# Patient Record
Sex: Male | Born: 1975
Health system: Southern US, Community
[De-identification: ages and names within clinical notes are randomized; demographics above are authoritative.]

## PROBLEM LIST (undated history)

## (undated) DIAGNOSIS — E785 Hyperlipidemia, unspecified: Secondary | ICD-10-CM

## (undated) DIAGNOSIS — I10 Essential (primary) hypertension: Secondary | ICD-10-CM

## (undated) HISTORY — DX: Essential (primary) hypertension: I10

## (undated) HISTORY — PX: FINGER SURGERY: SHX640

## (undated) HISTORY — DX: Hyperlipidemia, unspecified: E78.5

---

## 2002-11-02 ENCOUNTER — Emergency Department (HOSPITAL_COMMUNITY): Admission: EM | Admit: 2002-11-02 | Discharge: 2002-11-02 | Payer: Self-pay | Admitting: Emergency Medicine

## 2003-08-27 ENCOUNTER — Encounter: Payer: Self-pay | Admitting: Emergency Medicine

## 2003-08-27 ENCOUNTER — Emergency Department (HOSPITAL_COMMUNITY): Admission: EM | Admit: 2003-08-27 | Discharge: 2003-08-27 | Payer: Self-pay | Admitting: Emergency Medicine

## 2003-09-25 ENCOUNTER — Other Ambulatory Visit: Payer: Self-pay

## 2011-08-04 ENCOUNTER — Emergency Department: Payer: Self-pay | Admitting: Emergency Medicine

## 2014-08-06 DIAGNOSIS — R7301 Impaired fasting glucose: Secondary | ICD-10-CM | POA: Insufficient documentation

## 2014-08-06 DIAGNOSIS — E785 Hyperlipidemia, unspecified: Secondary | ICD-10-CM | POA: Insufficient documentation

## 2014-08-30 DIAGNOSIS — Z87438 Personal history of other diseases of male genital organs: Secondary | ICD-10-CM | POA: Insufficient documentation

## 2014-10-19 DIAGNOSIS — I10 Essential (primary) hypertension: Secondary | ICD-10-CM | POA: Insufficient documentation

## 2015-08-15 ENCOUNTER — Ambulatory Visit (INDEPENDENT_AMBULATORY_CARE_PROVIDER_SITE_OTHER): Payer: 59 | Admitting: Family Medicine

## 2015-08-15 ENCOUNTER — Encounter: Payer: Self-pay | Admitting: Family Medicine

## 2015-08-15 VITALS — BP 149/104 | HR 78 | Ht 72.0 in | Wt 242.0 lb

## 2015-08-15 DIAGNOSIS — Z23 Encounter for immunization: Secondary | ICD-10-CM | POA: Diagnosis not present

## 2015-08-15 DIAGNOSIS — R7301 Impaired fasting glucose: Secondary | ICD-10-CM

## 2015-08-15 DIAGNOSIS — E785 Hyperlipidemia, unspecified: Secondary | ICD-10-CM

## 2015-08-15 DIAGNOSIS — I10 Essential (primary) hypertension: Secondary | ICD-10-CM

## 2015-08-15 LAB — CBC
HCT: 44.7 % (ref 39.0–52.0)
Hemoglobin: 15.1 g/dL (ref 13.0–17.0)
MCH: 29.2 pg (ref 26.0–34.0)
MCHC: 33.8 g/dL (ref 30.0–36.0)
MCV: 86.5 fL (ref 78.0–100.0)
MPV: 11.5 fL (ref 8.6–12.4)
Platelets: 246 10*3/uL (ref 150–400)
RBC: 5.17 MIL/uL (ref 4.22–5.81)
RDW: 13.6 % (ref 11.5–15.5)
WBC: 9.1 10*3/uL (ref 4.0–10.5)

## 2015-08-15 LAB — LIPID PANEL
Cholesterol: 245 mg/dL — ABNORMAL HIGH (ref 125–200)
HDL: 30 mg/dL — ABNORMAL LOW (ref >=40)
LDL Cholesterol: 187 mg/dL — ABNORMAL HIGH (ref <130)
Total CHOL/HDL Ratio: 8.2 Ratio — ABNORMAL HIGH (ref <=5.0)
Triglycerides: 140 mg/dL (ref <150)
VLDL: 28 mg/dL (ref <30)

## 2015-08-15 LAB — URIC ACID: URIC ACID, SERUM: 8.4 mg/dL — AB (ref 4.0–7.8)

## 2015-08-15 LAB — COMPREHENSIVE METABOLIC PANEL
ALT: 16 U/L (ref 9–46)
AST: 17 U/L (ref 10–40)
Albumin: 4.6 g/dL (ref 3.6–5.1)
Alkaline Phosphatase: 70 U/L (ref 40–115)
BUN: 14 mg/dL (ref 7–25)
CO2: 31 mmol/L (ref 20–31)
Calcium: 10 mg/dL (ref 8.6–10.3)
Chloride: 100 mmol/L (ref 98–110)
Creat: 1.08 mg/dL (ref 0.60–1.35)
Glucose, Bld: 117 mg/dL — ABNORMAL HIGH (ref 65–99)
Potassium: 3.9 mmol/L (ref 3.5–5.3)
Sodium: 140 mmol/L (ref 135–146)
Total Bilirubin: 0.4 mg/dL (ref 0.2–1.2)
Total Protein: 7.5 g/dL (ref 6.1–8.1)

## 2015-08-15 MED ORDER — AMLODIPINE BESYLATE 10 MG PO TABS: 10.0000 mg | ORAL_TABLET | Freq: Every day | ORAL | Status: DC

## 2015-08-15 MED ORDER — CHLORTHALIDONE 25 MG PO TABS: 25.0000 mg | ORAL_TABLET | Freq: Every day | ORAL | Status: DC

## 2015-08-15 NOTE — Progress Notes (Signed)
Robert Cantrell is a 39 y.o. male who presents to [facility]  today for establish care and discuss blood pressure.  1) HTN: Currently reasonable control with chlorthalidone and amlodipine. No symptoms such as chest pain palpitations or shortness of breath. Patient does note a mild headache currently the last few days.  2) headache: Present for 2 days intermittent bilaterally posterior. No weakness or numbness loss of function or vision change. No treatment tried yet.  3) history of phimosis currently resolved status post surgery  4) history of elevated fasting blood sugar: No history of diabetes. No polyuria or polydipsia.   No past medical history on file. No past surgical history on file. Social History  Substance Use Topics  . Smoking status: Not on file  . Smokeless tobacco: Not on file  . Alcohol Use: Not on file   family history is not on file.  ROS as above; no weight loss or weight gain feeling too hot or feeling too cold. No skin changes. No abdominal pain nausea vomiting or diarrhea. Medications: No current outpatient prescriptions on file.   No current facility-administered medications for this visit.   Allergies not on file   Exam:  BP 149/104 mmHg  Pulse 78  Ht 6' (1.829 m)  Wt 242 lb (109.77 kg)  BMI 32.81 kg/m2 Gen: Well NAD HEENT: EOMI,  MMM normal posterior pharynx Lungs: Normal work of breathing. CTABL Heart: RRR no MRG Abd: NABS, Soft. Nondistended, Nontender Exts: Brisk capillary refill, warm and well perfused.  Psych: Alert and oriented normal affect and speech and thought process.  No results found for this or any previous visit (from the past 24 hour(s)). No results found.  Flu vaccine given prior to discharge today. Please see individual assessment and plan sections.

## 2015-08-15 NOTE — Patient Instructions (Signed)
Thank you for coming in today. Return in 1 month. Call or go to the emergency room if you get worse, have trouble breathing, have chest pains, or palpitations.   Hypertension Hypertension, commonly called high blood pressure, is when the force of blood pumping through your arteries is too strong. Your arteries are the blood vessels that carry blood from your heart throughout your body. A blood pressure reading consists of a higher number over a lower number, such as 110/72. The higher number (systolic) is the pressure inside your arteries when your heart pumps. The lower number (diastolic) is the pressure inside your arteries when your heart relaxes. Ideally you want your blood pressure below 120/80. Hypertension forces your heart to work harder to pump blood. Your arteries may become narrow or stiff. Having hypertension puts you at risk for heart disease, stroke, and other problems.  RISK FACTORS Some risk factors for high blood pressure are controllable. Others are not.  Risk factors you cannot control include:   Race. You may be at higher risk if you are African American.  Age. Risk increases with age.  Gender. Men are at higher risk than women before age 49 years. After age 58, women are at higher risk than men. Risk factors you can control include:  Not getting enough exercise or physical activity.  Being overweight.  Getting too much fat, sugar, calories, or salt in your diet.  Drinking too much alcohol. SIGNS AND SYMPTOMS Hypertension does not usually cause signs or symptoms. Extremely high blood pressure (hypertensive crisis) may cause headache, anxiety, shortness of breath, and nosebleed. DIAGNOSIS  To check if you have hypertension, your health care provider will measure your blood pressure while you are seated, with your arm held at the level of your heart. It should be measured at least twice using the same arm. Certain conditions can cause a difference in blood pressure  between your right and left arms. A blood pressure reading that is higher than normal on one occasion does not mean that you need treatment. If one blood pressure reading is high, ask your health care provider about having it checked again. TREATMENT  Treating high blood pressure includes making lifestyle changes and possibly taking medicine. Living a healthy lifestyle can help lower high blood pressure. You may need to change some of your habits. Lifestyle changes may include:  Following the DASH diet. This diet is high in fruits, vegetables, and whole grains. It is low in salt, red meat, and added sugars.  Getting at least 2 hours of brisk physical activity every week.  Losing weight if necessary.  Not smoking.  Limiting alcoholic beverages.  Learning ways to reduce stress. If lifestyle changes are not enough to get your blood pressure under control, your health care provider may prescribe medicine. You may need to take more than one. Work closely with your health care provider to understand the risks and benefits. HOME CARE INSTRUCTIONS  Have your blood pressure rechecked as directed by your health care provider.   Take medicines only as directed by your health care provider. Follow the directions carefully. Blood pressure medicines must be taken as prescribed. The medicine does not work as well when you skip doses. Skipping doses also puts you at risk for problems.   Do not smoke.   Monitor your blood pressure at home as directed by your health care provider. SEEK MEDICAL CARE IF:   You think you are having a reaction to medicines taken.  You have recurrent  headaches or feel dizzy.  You have swelling in your ankles.  You have trouble with your vision. SEEK IMMEDIATE MEDICAL CARE IF:  You develop a severe headache or confusion.  You have unusual weakness, numbness, or feel faint.  You have severe chest or abdominal pain.  You vomit repeatedly.  You have trouble  breathing. MAKE SURE YOU:   Understand these instructions.  Will watch your condition.  Will get help right away if you are not doing well or get worse. Document Released: 11/02/2005 Document Revised: 03/19/2014 Document Reviewed: 08/25/2013 [facility] Patient Information 2015 [place], [place]. This information is not intended to replace advice given to you by your health care provider. Make sure you discuss any questions you have with your health care provider.

## 2015-08-15 NOTE — Assessment & Plan Note (Signed)
Increase amlodipine from 5 mg to 10 mg. Continue chlorthalidone. Check metabolic panel, and TSH. Return in one month

## 2015-08-15 NOTE — Assessment & Plan Note (Signed)
Check fasting glucose today 

## 2015-08-15 NOTE — Assessment & Plan Note (Signed)
Check fasting lipids and vitamin D and CBC today.

## 2015-08-16 LAB — VITAMIN D 25 HYDROXY (VIT D DEFICIENCY, FRACTURES): VIT D 25 HYDROXY: 20 ng/mL — AB (ref 30–100)

## 2015-08-16 LAB — TSH: TSH: 1.14 u[IU]/mL (ref 0.350–4.500)

## 2015-08-16 MED ORDER — ATORVASTATIN CALCIUM 40 MG PO TABS: 40.0000 mg | ORAL_TABLET | Freq: Every day | ORAL | Status: DC

## 2015-08-16 NOTE — Addendum Note (Signed)
Addended by: Rodolph Bong on: 08/16/2015 08:18 AM   Modules accepted: Orders, SmartSet

## 2015-08-16 NOTE — Progress Notes (Signed)
Quick Note:  1) patient has prediabetes. Work on diet and weight loss. 2) patient has elevated cholesterol. We'll start Lipitor 3) vitamin D is low. Certainly taking 4000 units of vitamin D daily over-the-counter. 4) uric acid is elevated. This increases patient's risk for gout. We'll discuss oriented about this further at the next visit. No treatment needed at this time ______

## 2015-08-20 ENCOUNTER — Telehealth: Payer: Self-pay | Admitting: Family Medicine

## 2015-09-04 ENCOUNTER — Telehealth: Payer: Self-pay

## 2015-09-04 DIAGNOSIS — I10 Essential (primary) hypertension: Secondary | ICD-10-CM

## 2015-09-04 MED ORDER — AMLODIPINE BESYLATE 10 MG PO TABS: 10.0000 mg | ORAL_TABLET | Freq: Every day | ORAL | Status: DC

## 2015-09-04 NOTE — Telephone Encounter (Signed)
Medication sent to wrong pharmacy will resend.

## 2015-09-12 ENCOUNTER — Ambulatory Visit: Payer: 59 | Admitting: Family Medicine

## 2015-09-23 ENCOUNTER — Ambulatory Visit (INDEPENDENT_AMBULATORY_CARE_PROVIDER_SITE_OTHER): Payer: 59 | Admitting: Family Medicine

## 2015-09-23 ENCOUNTER — Encounter: Payer: Self-pay | Admitting: Family Medicine

## 2015-09-23 VITALS — BP 133/89 | HR 88 | Wt 241.0 lb

## 2015-09-23 DIAGNOSIS — E785 Hyperlipidemia, unspecified: Secondary | ICD-10-CM | POA: Insufficient documentation

## 2015-09-23 DIAGNOSIS — R7989 Other specified abnormal findings of blood chemistry: Secondary | ICD-10-CM | POA: Diagnosis not present

## 2015-09-23 DIAGNOSIS — I10 Essential (primary) hypertension: Secondary | ICD-10-CM

## 2015-09-23 DIAGNOSIS — R7301 Impaired fasting glucose: Secondary | ICD-10-CM | POA: Diagnosis not present

## 2015-09-23 DIAGNOSIS — E790 Hyperuricemia without signs of inflammatory arthritis and tophaceous disease: Secondary | ICD-10-CM | POA: Insufficient documentation

## 2015-09-23 LAB — COMPREHENSIVE METABOLIC PANEL
ALK PHOS: 90 U/L (ref 40–115)
ALT: 25 U/L (ref 9–46)
AST: 22 U/L (ref 10–40)
Albumin: 4.5 g/dL (ref 3.6–5.1)
BILIRUBIN TOTAL: 0.7 mg/dL (ref 0.2–1.2)
BUN: 14 mg/dL (ref 7–25)
CO2: 30 mmol/L (ref 20–31)
Calcium: 9.8 mg/dL (ref 8.6–10.3)
Chloride: 98 mmol/L (ref 98–110)
Creat: 0.99 mg/dL (ref 0.60–1.35)
GLUCOSE: 104 mg/dL — AB (ref 65–99)
Potassium: 3.3 mmol/L — ABNORMAL LOW (ref 3.5–5.3)
Sodium: 141 mmol/L (ref 135–146)
Total Protein: 7.7 g/dL (ref 6.1–8.1)

## 2015-09-23 LAB — LIPID PANEL
Cholesterol: 169 mg/dL (ref 125–200)
HDL: 32 mg/dL — ABNORMAL LOW (ref >=40)
LDL Cholesterol: 114 mg/dL (ref <130)
Total CHOL/HDL Ratio: 5.3 Ratio — ABNORMAL HIGH (ref <=5.0)
Triglycerides: 114 mg/dL (ref <150)
VLDL: 23 mg/dL (ref <30)

## 2015-09-23 LAB — CK: CK TOTAL: 205 U/L (ref 7–232)

## 2015-09-23 NOTE — Assessment & Plan Note (Signed)
No gout flares. Low purine diet

## 2015-09-23 NOTE — Progress Notes (Signed)
Robert LaineKelvin Cantrell is a 39 y.o. male who presents to [facility]  today for follow-up blood pressure and lipids. Patient was seen about a month ago and his amlodipine was increased and he was started on Lipitor.  1) blood pressure: Doing well with no issues. No chest pains palpitations or shortness of breath. Tolerating medicines well.  2) elevated cholesterol: Patient was started on Lipitor. He feels well with no issues. No muscle pain.  3) elevated fasting blood sugar: Patient was found to have elevated fasting blood sugar. He denies any polyuria or polydipsia. He denies any history of diabetes.  4) elevated uric acid: Patient was found to have an elevated uric acid level of 8.4. He denies any personal history of gout flares. He feels well.   Past Medical History  Diagnosis Date  . Hypertension    Past Surgical History  Procedure Laterality Date  . Finger surgery     Social History  Substance Use Topics  . Smoking status: Never Smoker   . Smokeless tobacco: Not on file  . Alcohol Use: 0.0 oz/week    0 Standard drinks or equivalent per week   family history includes Diabetes in his father, maternal grandfather, maternal grandmother, mother, paternal grandfather, and paternal grandmother; Hypertension in his brother, father, mother, and sister.  ROS as above Medications: Current Outpatient Prescriptions  Medication Sig Dispense Refill  . amLODipine (NORVASC) 10 MG tablet Take 1 tablet (10 mg total) by mouth daily. 30 tablet 1  . atorvastatin (LIPITOR) 40 MG tablet Take 1 tablet (40 mg total) by mouth daily. 30 tablet 3  . chlorthalidone (HYGROTON) 25 MG tablet Take 1 tablet (25 mg total) by mouth daily. 30 tablet 1   No current facility-administered medications for this visit.   No Known Allergies   Exam:  BP 133/89 mmHg  Pulse 88  Wt 241 lb (109.317 kg) Gen: Well NAD HEENT: EOMI,  MMM Lungs: Normal work of breathing. CTABL Heart: RRR  no MRG Abd: NABS, Soft. Nondistended, Nontender Exts: Brisk capillary refill, warm and well perfused.   No results found for this or any previous visit (from the past 24 hour(s)). No results found.   Please see individual assessment and plan sections.

## 2015-09-23 NOTE — Assessment & Plan Note (Signed)
-   Weight loss 

## 2015-09-23 NOTE — Assessment & Plan Note (Signed)
Control improved. Continue current regimen. Recheck in 6 months. Check metabolic panel today.

## 2015-09-23 NOTE — Patient Instructions (Signed)
Thank you for coming in today. Return in 6 months.  Call or go to the emergency room if you get worse, have trouble breathing, have chest pains, or palpitations.

## 2015-09-24 NOTE — Progress Notes (Signed)
Quick Note:  Labs look great ______ 

## 2015-10-09 NOTE — Telephone Encounter (Signed)
Note opened in error.

## 2015-11-12 ENCOUNTER — Other Ambulatory Visit: Payer: Self-pay

## 2015-11-12 DIAGNOSIS — I10 Essential (primary) hypertension: Secondary | ICD-10-CM

## 2015-11-12 MED ORDER — AMLODIPINE BESYLATE 10 MG PO TABS: 10.0000 mg | ORAL_TABLET | Freq: Every day | ORAL | Status: DC

## 2015-11-12 NOTE — Telephone Encounter (Signed)
Patient called requesting norvasc refill.  After reviewing the chart, refill approved and sent to [facility].  Patient aware.

## 2015-11-22 MED FILL — ATORVASTATIN 40 MG TABLET: 40 | 30 days supply | Qty: 30 | Fill #2

## 2015-11-25 ENCOUNTER — Other Ambulatory Visit: Payer: Self-pay | Admitting: Family Medicine

## 2015-11-25 DIAGNOSIS — I10 Essential (primary) hypertension: Secondary | ICD-10-CM

## 2015-11-25 MED ORDER — CHLORTHALIDONE 25 MG PO TABS: 25.0000 mg | ORAL_TABLET | Freq: Every day | ORAL | Status: DC

## 2015-11-25 MED FILL — CHLORTHALIDONE 25 MG TABLET: 25 | 30 days supply | Qty: 30 | Fill #0

## 2015-12-24 ENCOUNTER — Encounter (HOSPITAL_COMMUNITY): Payer: Self-pay | Admitting: Emergency Medicine

## 2015-12-24 ENCOUNTER — Emergency Department (HOSPITAL_COMMUNITY)
Admission: EM | Admit: 2015-12-24 | Discharge: 2015-12-24 | Disposition: A | Discharge status: Home / Self Care | Payer: 59 | Source: Home / Self Care | Attending: Family Medicine | Admitting: Family Medicine

## 2015-12-24 DIAGNOSIS — J340 Abscess, furuncle and carbuncle of nose: Secondary | ICD-10-CM | POA: Diagnosis not present

## 2015-12-24 MED ORDER — CEPHALEXIN 500 MG PO CAPS: 500.0000 mg | ORAL_CAPSULE | Freq: Three times a day (TID) | ORAL | Status: DC

## 2015-12-24 MED ORDER — MUPIROCIN CALCIUM 2 % NA OINT: TOPICAL_OINTMENT | NASAL | Status: DC

## 2015-12-24 NOTE — ED Provider Notes (Signed)
CSN: [id number]     Arrival date & time 12/24/15  1736 History   First MD Initiated Contact with Patient 12/24/15 1922     No chief complaint on file.  (Consider location/radiation/quality/duration/timing/severity/associated sxs/prior Treatment) HPI History obtained from patient:   LOCATION:face/nose SEVERITY:4 DURATION:2 days CONTEXT:sudden onset of swelling and pain in right nostril QUALITY: MODIFYING FACTORS:none  ASSOCIATED SYMPTOMS:pain in right ear TIMING:constant OCCUPATION:  Past Medical History  Diagnosis Date  . Hypertension    Past Surgical History  Procedure Laterality Date  . Finger surgery     Family History  Problem Relation Age of Onset  . Diabetes Mother   . Hypertension Mother   . Diabetes Father   . Hypertension Father   . Hypertension Sister   . Hypertension Brother   . Diabetes Maternal Grandmother   . Diabetes Maternal Grandfather   . Diabetes Paternal Grandmother   . Diabetes Paternal Grandfather    Social History  Substance Use Topics  . Smoking status: Never Smoker   . Smokeless tobacco: Not on file  . Alcohol Use: 0.0 oz/week    0 Standard drinks or equivalent per week    Review of Systems ROS +'venasal pain  Denies: HEADACHE, NAUSEA, ABDOMINAL PAIN, CHEST PAIN, CONGESTION, DYSURIA, SHORTNESS OF BREATH  Allergies  Review of patient's allergies indicates no known allergies.  Home Medications   Prior to Admission medications   Medication Sig Start Date End Date Taking? Authorizing Provider  amLODipine (NORVASC) 10 MG tablet Take 1 tablet (10 mg total) by mouth daily. 11/12/15   Rodolph Bong, MD  atorvastatin (LIPITOR) 40 MG tablet Take 1 tablet (40 mg total) by mouth daily. 08/16/15   Rodolph Bong, MD  chlorthalidone (HYGROTON) 25 MG tablet Take 1 tablet (25 mg total) by mouth daily. 11/25/15 11/24/16  Rodolph Bong, MD   Meds Ordered and Administered this Visit  Medications - No data to display  There were no vitals taken for this  visit. No data found.   Physical Exam  Constitutional: He is oriented to person, place, and time. He appears well-developed and well-nourished.  HENT:  Head: Normocephalic and atraumatic.  Right Ear: External ear normal.  Left Ear: External ear normal.  Nose: Sinus tenderness present.    Very small abscess on floor of nostril. No I&D.  Pulmonary/Chest: Effort normal and breath sounds normal.  Musculoskeletal: Normal range of motion.  Neurological: He is alert and oriented to person, place, and time.  Skin: Skin is warm and dry.  Psychiatric: He has a normal mood and affect. His behavior is normal.  Nursing note and vitals reviewed.   ED Course  Procedures (including critical care time)  Labs Review Labs Reviewed - No data to display  Imaging Review No results found.   Visual Acuity Review  Right Eye Distance:   Left Eye Distance:   Bilateral Distance:    Right Eye Near:   Left Eye Near:    Bilateral Near:         MDM   1. Abscess of nasal cavity    Patient is advised to continue home symptomatic treatment. Prescription for Keflex, Bactroban  sent pharmacy patient has indicated. Patient is advised that if there are new or worsening symptoms or attend the emergency department, or contact primary care provider. Instructions of care provided discharged home in stable condition. Return to work/school note provided.  THIS NOTE WAS GENERATED USING A VOICE RECOGNITION SOFTWARE PROGRAM. ALL REASONABLE EFFORTS  WERE MADE  TO PROOFREAD THIS DOCUMENT FOR ACCURACY.     Tharon Aquas, PA 12/24/15 [phone]

## 2015-12-24 NOTE — ED Notes (Signed)
Pt reports abscess inside right nostril onset x1 week associated w/pain 8/10 that radiates to back of head A&O x4... No acute distress.

## 2015-12-24 NOTE — Discharge Instructions (Signed)

## 2015-12-25 MED FILL — MUPIROCIN 2% OINTMENT: 2 | 30 days supply | Qty: 22 | Fill #0

## 2015-12-25 MED FILL — CEPHALEXIN 500 MG CAPSULE: 500 | 7 days supply | Qty: 21 | Fill #0

## 2015-12-27 ENCOUNTER — Other Ambulatory Visit: Payer: Self-pay | Admitting: Family Medicine

## 2015-12-27 MED FILL — CHLORTHALIDONE 25 MG TABLET: 25 | 30 days supply | Qty: 30 | Fill #1

## 2015-12-27 MED FILL — ATORVASTATIN 40 MG TABLET: 40 | 90 days supply | Qty: 90 | Fill #0

## 2016-02-03 MED FILL — CHLORTHALIDONE 25 MG TABLET: 25 | 30 days supply | Qty: 30 | Fill #2

## 2016-02-21 MED FILL — AMLODIPINE BESYLATE 10 MG T: 10 | 90 days supply | Qty: 90 | Fill #1

## 2016-02-26 ENCOUNTER — Ambulatory Visit (INDEPENDENT_AMBULATORY_CARE_PROVIDER_SITE_OTHER): Payer: 59

## 2016-02-26 ENCOUNTER — Encounter: Payer: Self-pay | Admitting: Family Medicine

## 2016-02-26 ENCOUNTER — Ambulatory Visit (INDEPENDENT_AMBULATORY_CARE_PROVIDER_SITE_OTHER): Payer: 59 | Admitting: Family Medicine

## 2016-02-26 VITALS — BP 133/91 | HR 69 | Wt 244.0 lb

## 2016-02-26 DIAGNOSIS — W19XXXA Unspecified fall, initial encounter: Secondary | ICD-10-CM

## 2016-02-26 DIAGNOSIS — M25512 Pain in left shoulder: Secondary | ICD-10-CM

## 2016-02-26 MED ORDER — NAPROXEN 500 MG PO TABS
500.0000 mg | ORAL_TABLET | Freq: Two times a day (BID) | ORAL | Status: AC
Start: 2016-02-26 — End: ?

## 2016-02-26 MED FILL — NAPROXEN 500 MG TABLET: 500 | 15 days supply | Qty: 30 | Fill #0

## 2016-02-26 NOTE — Patient Instructions (Addendum)
Thank you for coming in today. We will call with results.  Return in 2 weeks.  Take naproxen for pain as needed.  Use the sling.

## 2016-02-26 NOTE — Assessment & Plan Note (Signed)
Fall at work. This is a work-related injury. He has what appears to be a fracture of the acromion. However the screening used to view the images is not technically adequate for this. Plan for sling NSAIDs and recheck in 2 weeks. Awaiting formal radiology review when the PACS system is operable.

## 2016-02-26 NOTE — Progress Notes (Signed)
   Robert Cantrell is a 40 y.o. male who presents to [facility] today for fall. Patient fell yesterday at work landing on his back. He thinks he may have landed on his outstretched left arm. He denies any immediate severe pain directly after the fall. However he notes worsening soreness in his back and especially into his left shoulder. He notes severe pain especially with overhand motion on the left side. He denies any radiating pain weakness or numbness. He's tried some Excedrin which helped a bit yesterday evening.   Past Medical History  Diagnosis Date  . Hypertension    Past Surgical History  Procedure Laterality Date  . Finger surgery     Social History  Substance Use Topics  . Smoking status: Never Smoker   . Smokeless tobacco: Not on file  . Alcohol Use: 0.0 oz/week    0 Standard drinks or equivalent per week   family history includes Diabetes in his father, maternal grandfather, maternal grandmother, mother, paternal grandfather, and paternal grandmother; Hypertension in his brother, father, mother, and sister.  ROS:  No headache, visual changes, nausea, vomiting, diarrhea, constipation, dizziness, abdominal pain, skin rash, fevers, chills, night sweats, weight loss, swollen lymph nodes, body aches, joint swelling, muscle aches, chest pain, shortness of breath, mood changes, visual or auditory hallucinations.    Medications: Current Outpatient Prescriptions  Medication Sig Dispense Refill  . amLODipine (NORVASC) 10 MG tablet Take 1 tablet (10 mg total) by mouth daily. 90 tablet 1  . atorvastatin (LIPITOR) 40 MG tablet TAKE 1 TABLET BY MOUTH DAILY 30 tablet PRN  . chlorthalidone (HYGROTON) 25 MG tablet Take 1 tablet (25 mg total) by mouth daily. 30 tablet 4  . mupirocin nasal ointment (BACTROBAN) 2 % Apply in each nostril daily 10 g 1   No current facility-administered medications for this visit.   No Known Allergies   Exam:    BP 133/91 mmHg  Pulse 69  Wt 244 lb (110.678 kg) General: Well Developed, well nourished, and in no acute distress.  Neuro/Psych: Alert and oriented x3, extra-ocular muscles intact, able to move all 4 extremities, sensation grossly intact. Skin: Warm and dry, no rashes noted.  Respiratory: Not using accessory muscles, speaking in full sentences, trachea midline.  Cardiovascular: Pulses palpable, no extremity edema. Abdomen: Does not appear distended. MSK: Neck is nontender to spinal midline. Back is nontender to spinal midline. He is diffusely tender to the thoracic and lumbar paraspinals. Left shoulder is normal appearing but is tender especially over the [facility] joint area. Significantly decreased motion especially with external rotation abduction. Internal rotation is limited to the iliac crest. Patient guards with shoulder exam. Patient is generally weak with abduction and external rotation resistance strength testing 4/5 Pulses capillary refill and sensation intact.   X-ray left shoulder: Review shows a mildly angulated inferiorly displaced fracture of the acromion.  X-rays reviewed on the x-ray device screen as the PACS system is not currently operable area Awaiting formal radiology review  No results found for this or any previous visit (from the past 24 hour(s)). No results found.   Please see individual assessment and plan sections.

## 2016-02-27 NOTE — Progress Notes (Signed)
Quick Note:  The shoulder xray has the appearance of an old injury and not a new fracture. I think we can continue the sling for a week. Recheck next week. ______

## 2016-03-11 ENCOUNTER — Encounter: Payer: Self-pay | Admitting: Family Medicine

## 2016-03-11 ENCOUNTER — Ambulatory Visit (INDEPENDENT_AMBULATORY_CARE_PROVIDER_SITE_OTHER): Payer: 59 | Admitting: Family Medicine

## 2016-03-11 ENCOUNTER — Telehealth: Payer: Self-pay | Admitting: Family Medicine

## 2016-03-11 VITALS — BP 133/92 | HR 92 | Wt 246.0 lb

## 2016-03-11 DIAGNOSIS — R7989 Other specified abnormal findings of blood chemistry: Secondary | ICD-10-CM

## 2016-03-11 DIAGNOSIS — M25512 Pain in left shoulder: Secondary | ICD-10-CM

## 2016-03-11 DIAGNOSIS — R6882 Decreased libido: Secondary | ICD-10-CM

## 2016-03-11 LAB — TSH: TSH: 1.98 mIU/L (ref 0.40–4.50)

## 2016-03-11 LAB — TESTOSTERONE: TESTOSTERONE: 235 ng/dL — AB (ref 250–827)

## 2016-03-11 MED FILL — CHLORTHALIDONE 25 MG TABLET: 25 | 30 days supply | Qty: 30 | Fill #3

## 2016-03-11 NOTE — Patient Instructions (Signed)
Thank you for coming in today. Resume work.  Attend PT.  Return in 3-4 weeks.

## 2016-03-11 NOTE — Progress Notes (Signed)
   Robert Cantrell is a 40 y.o. male who presents to [facility] today for follow-up left shoulder pain. Patient was seen on April 12 for work-related injury to his left shoulder. X-ray shows a displacement of os acromiale versus old fracture to the acromion. He was treated conservatively with mild immobilization and work restriction. In the interim he notes his pain has resolved. He he denies any weakness and notes only some mild shoulder stiffness. He like to return to work full duties.   Past Medical History  Diagnosis Date  . Hypertension    Past Surgical History  Procedure Laterality Date  . Finger surgery     Social History  Substance Use Topics  . Smoking status: Never Smoker   . Smokeless tobacco: Not on file  . Alcohol Use: 0.0 oz/week    0 Standard drinks or equivalent per week   family history includes Diabetes in his father, maternal grandfather, maternal grandmother, mother, paternal grandfather, and paternal grandmother; Hypertension in his brother, father, mother, and sister.  ROS:  No headache, visual changes, nausea, vomiting, diarrhea, constipation, dizziness, abdominal pain, skin rash, fevers, chills, night sweats, weight loss, swollen lymph nodes, body aches, joint swelling, muscle aches, chest pain, shortness of breath, mood changes, visual or auditory hallucinations.    Medications: Current Outpatient Prescriptions  Medication Sig Dispense Refill  . amLODipine (NORVASC) 10 MG tablet Take 1 tablet (10 mg total) by mouth daily. 90 tablet 1  . atorvastatin (LIPITOR) 40 MG tablet TAKE 1 TABLET BY MOUTH DAILY 30 tablet PRN  . chlorthalidone (HYGROTON) 25 MG tablet Take 1 tablet (25 mg total) by mouth daily. 30 tablet 4  . mupirocin nasal ointment (BACTROBAN) 2 % Apply in each nostril daily 10 g 1  . naproxen (NAPROSYN) 500 MG tablet Take 1 tablet (500 mg total) by mouth 2 (two) times daily with a meal. 30 tablet 0   No  current facility-administered medications for this visit.   No Known Allergies   Exam:  BP 133/92 mmHg  Pulse 92  Wt 246 lb (111.585 kg) General: Well Developed, well nourished, and in no acute distress.  Neuro/Psych: Alert and oriented x3, extra-ocular muscles intact, able to move all 4 extremities, sensation grossly intact. Skin: Warm and dry, no rashes noted.  Respiratory: Not using accessory muscles, speaking in full sentences, trachea midline.  Cardiovascular: Pulses palpable, no extremity edema. Abdomen: Does not appear distended. MSK: Left shoulder is normal appearing and nontender with full motion and strength. Negative impingement   No results found for this or any previous visit (from the past 24 hour(s)). No results found.    40 year old male with left shoulder injury. Return to work full duties and start physical therapy. Recheck in 3-4 weeks or so.

## 2016-03-11 NOTE — Telephone Encounter (Signed)
Pt notes low libido without ED. Plan to check testosterone and TSH and f/u for discussion

## 2016-03-12 ENCOUNTER — Other Ambulatory Visit: Payer: Self-pay

## 2016-03-12 ENCOUNTER — Other Ambulatory Visit: Payer: Self-pay | Admitting: Family Medicine

## 2016-03-12 DIAGNOSIS — R7989 Other specified abnormal findings of blood chemistry: Secondary | ICD-10-CM

## 2016-03-12 NOTE — Telephone Encounter (Signed)
Testosterone is low. We'll recheck in 1-2 weeks in the morning.

## 2016-03-12 NOTE — Telephone Encounter (Signed)
Quick Note:  Testosterone is low. We will reorder the lab with some other labs to be redrawn in about a week or so. ______

## 2016-03-12 NOTE — Addendum Note (Signed)
Addended by: Rodolph BongOREY, Bernette Seeman S on: 03/12/2016 07:57 AM   Modules accepted: Orders

## 2016-03-23 ENCOUNTER — Ambulatory Visit (INDEPENDENT_AMBULATORY_CARE_PROVIDER_SITE_OTHER): Payer: 59 | Admitting: Family Medicine

## 2016-03-23 DIAGNOSIS — Z5329 Procedure and treatment not carried out because of patient's decision for other reasons: Secondary | ICD-10-CM

## 2016-03-23 NOTE — Progress Notes (Signed)
No show. F/u Soon 

## 2016-03-24 DIAGNOSIS — E291 Testicular hypofunction: Secondary | ICD-10-CM | POA: Diagnosis not present

## 2016-03-25 LAB — FOLLICLE STIMULATING HORMONE: FSH: 6.2 m[IU]/mL (ref 1.6–8.0)

## 2016-03-25 LAB — TESTOSTERONE TOTAL,FREE,BIO, MALES
ALBUMIN: 4.3 g/dL (ref 3.6–5.1)
Sex Hormone Binding: 24 nmol/L (ref 10–50)
TESTOSTERONE BIOAVAILABLE: 76.2 ng/dL — AB (ref 130.5–681.7)
TESTOSTERONE FREE: 38.7 pg/mL — AB (ref 47.0–244.0)
Testosterone: 240 ng/dL — ABNORMAL LOW (ref 250–827)

## 2016-03-25 LAB — LUTEINIZING HORMONE: LH: 3.3 m[IU]/mL (ref 1.5–9.3)

## 2016-03-26 NOTE — Progress Notes (Signed)
Quick Note:  Testosterone is low. Return for discussion of plan. ______

## 2016-04-14 MED FILL — CHLORTHALIDONE 25 MG TABLET: 25 | 30 days supply | Qty: 30 | Fill #4

## 2016-04-14 MED FILL — ATORVASTATIN 40 MG TABLET: 40 | 90 days supply | Qty: 90 | Fill #1

## 2016-05-04 ENCOUNTER — Ambulatory Visit (INDEPENDENT_AMBULATORY_CARE_PROVIDER_SITE_OTHER): Payer: 59 | Admitting: Family Medicine

## 2016-05-04 DIAGNOSIS — Z5329 Procedure and treatment not carried out because of patient's decision for other reasons: Secondary | ICD-10-CM

## 2016-05-05 ENCOUNTER — Ambulatory Visit (INDEPENDENT_AMBULATORY_CARE_PROVIDER_SITE_OTHER): Payer: 59 | Admitting: Family Medicine

## 2016-05-05 ENCOUNTER — Telehealth: Payer: Self-pay | Admitting: Family Medicine

## 2016-05-05 ENCOUNTER — Encounter: Payer: Self-pay | Admitting: Family Medicine

## 2016-05-05 DIAGNOSIS — Z5329 Procedure and treatment not carried out because of patient's decision for other reasons: Secondary | ICD-10-CM

## 2016-05-05 NOTE — Progress Notes (Signed)
No show #4. Pt dismissed from practice. Emergency visits only.  Will refill medicines x 1 month only.

## 2016-05-05 NOTE — Telephone Encounter (Signed)
I let patient know about his no shows and dismissal from our practice and he said ok and he will need a month of refills to hold him over until her finds another provider. Thanks

## 2016-05-05 NOTE — Progress Notes (Signed)
No show #3 in less than 1 year. Dismissed from practice.

## 2016-05-14 ENCOUNTER — Ambulatory Visit: Payer: 59 | Admitting: Medical

## 2016-05-15 ENCOUNTER — Telehealth: Payer: Self-pay

## 2016-05-15 NOTE — Telephone Encounter (Signed)
Patient states he will call me back on his lunch break.

## 2016-05-18 ENCOUNTER — Ambulatory Visit (INDEPENDENT_AMBULATORY_CARE_PROVIDER_SITE_OTHER): Payer: 59 | Admitting: Medical

## 2016-05-18 ENCOUNTER — Encounter: Payer: Self-pay | Admitting: Medical

## 2016-05-18 ENCOUNTER — Ambulatory Visit (HOSPITAL_BASED_OUTPATIENT_CLINIC_OR_DEPARTMENT_OTHER)
Admission: RE | Admit: 2016-05-18 | Discharge: 2016-05-18 | Disposition: A | Discharge status: Home / Self Care | Payer: 59 | Source: Ambulatory Visit | Attending: Medical | Admitting: Medical

## 2016-05-18 ENCOUNTER — Telehealth: Payer: Self-pay | Admitting: Medical

## 2016-05-18 VITALS — BP 121/83 | HR 94 | Temp 98.4°F | Ht 71.0 in | Wt 247.6 lb

## 2016-05-18 DIAGNOSIS — R0683 Snoring: Secondary | ICD-10-CM | POA: Diagnosis not present

## 2016-05-18 DIAGNOSIS — M7732 Calcaneal spur, left foot: Secondary | ICD-10-CM | POA: Diagnosis not present

## 2016-05-18 DIAGNOSIS — I10 Essential (primary) hypertension: Secondary | ICD-10-CM | POA: Diagnosis not present

## 2016-05-18 DIAGNOSIS — M722 Plantar fascial fibromatosis: Secondary | ICD-10-CM | POA: Diagnosis not present

## 2016-05-18 DIAGNOSIS — M7662 Achilles tendinitis, left leg: Secondary | ICD-10-CM

## 2016-05-18 DIAGNOSIS — E876 Hypokalemia: Secondary | ICD-10-CM

## 2016-05-18 DIAGNOSIS — E785 Hyperlipidemia, unspecified: Secondary | ICD-10-CM | POA: Diagnosis not present

## 2016-05-18 DIAGNOSIS — R739 Hyperglycemia, unspecified: Secondary | ICD-10-CM

## 2016-05-18 DIAGNOSIS — M7731 Calcaneal spur, right foot: Secondary | ICD-10-CM | POA: Diagnosis not present

## 2016-05-18 LAB — LIPID PANEL
Cholesterol: 147 mg/dL (ref 0–200)
HDL: 28.8 mg/dL — AB (ref >39.00)
LDL Cholesterol: 98 mg/dL (ref 0–99)
NONHDL: 118.66
TRIGLYCERIDES: 104 mg/dL (ref 0.0–149.0)
Total CHOL/HDL Ratio: 5
VLDL: 20.8 mg/dL (ref 0.0–40.0)

## 2016-05-18 LAB — COMPREHENSIVE METABOLIC PANEL
ALK PHOS: 77 U/L (ref 39–117)
ALT: 17 U/L (ref 0–53)
AST: 17 U/L (ref 0–37)
Albumin: 4.4 g/dL (ref 3.5–5.2)
BUN: 15 mg/dL (ref 6–23)
CO2: 30 meq/L (ref 19–32)
Calcium: 9.5 mg/dL (ref 8.4–10.5)
Chloride: 101 mEq/L (ref 96–112)
Creatinine, Ser: 0.98 mg/dL (ref 0.40–1.50)
GFR: 109.63 mL/min (ref >60.00)
GLUCOSE: 128 mg/dL — AB (ref 70–99)
POTASSIUM: 3.1 meq/L — AB (ref 3.5–5.1)
Sodium: 139 mEq/L (ref 135–145)
TOTAL PROTEIN: 7.7 g/dL (ref 6.0–8.3)
Total Bilirubin: 0.5 mg/dL (ref 0.2–1.2)

## 2016-05-18 LAB — HEMOGLOBIN A1C: Hgb A1c MFr Bld: 6.5 % (ref 4.6–6.5)

## 2016-05-18 MED ORDER — POTASSIUM CHLORIDE CRYS ER 10 MEQ PO TBCR: 10.0000 meq | EXTENDED_RELEASE_TABLET | Freq: Every day | ORAL | Status: DC

## 2016-05-18 NOTE — Progress Notes (Signed)
Pre visit review using our clinic review tool, if applicable. No additional management support is needed unless otherwise documented below in the visit note. 

## 2016-05-18 NOTE — Progress Notes (Signed)
Subjective:    Patient ID: Robert Cantrell, male    DOB: 1976-06-16, 40 y.o.   MRN: [id number]  HPI  I have reviewed pt PMH, PSH, FH, Social History and Surgical History  Pt in for first visit. Pt got discharged from other practice no shows.  Pt works part times [employer] [place]  Pt does not exercise regularly except softball, drinks sodas occasionally. Nonsmoker, married- 2 children.  Pt has hypertension- bp is well controlled today. No cardiac or neurologic signs or symptoms.  Pt hyperlipidemia hx- on lipitor. Last check 7 months ago. hdl mild low.  Pt reports some lt heel area pain and achilles tendon pain. Rt side arch pain that runs toward heel and some toward achiles tendon. These area hurts for 2-3 months. Hurts all day long.  Pt has been snoring for a long time/years. But wife thinks it is louder. Some waking up at night. Pt states sometimes still tired even though goes to sleep early.      Review of Systems  Constitutional: Negative for chills and fatigue.  HENT: Negative for congestion, ear discharge, ear pain, sneezing and voice change.   Respiratory: Negative for cough, chest tightness, shortness of breath and wheezing.        Snores at night. someties wakes up with little cough.  Gastrointestinal: Negative for abdominal pain.  Neurological: Negative for dizziness and headaches.  Hematological: Negative for adenopathy. Does not bruise/bleed easily.  Psychiatric/Behavioral: Negative for confusion, sleep disturbance, dysphoric mood and agitation. The patient is not nervous/anxious.     Past Medical History  Diagnosis Date  . Hypertension   . Hyperlipidemia      Social History   Social History  . Marital Status: Single    Spouse Name: N/A  . Number of Children: N/A  . Years of Education: N/A   Occupational History  . Not on file.   Social History Main Topics  . Smoking status: Never Smoker   . Smokeless tobacco: Not on file  . Alcohol  Use: 0.0 oz/week    0 Standard drinks or equivalent per week     Comment: special occasions only.  . Drug Use: No  . Sexual Activity:    Partners: Female   Other Topics Concern  . Not on file   Social History Narrative    Past Surgical History  Procedure Laterality Date  . Finger surgery      Family History  Problem Relation Age of Onset  . Diabetes Mother   . Hypertension Mother   . Diabetes Father   . Hypertension Father   . Hypertension Sister   . Hypertension Brother   . Diabetes Maternal Grandmother   . Diabetes Maternal Grandfather   . Diabetes Paternal Grandmother   . Diabetes Paternal Grandfather     No Known Allergies  Current Outpatient Prescriptions on File Prior to Visit  Medication Sig Dispense Refill  . amLODipine (NORVASC) 10 MG tablet Take 1 tablet (10 mg total) by mouth daily. 90 tablet 1  . atorvastatin (LIPITOR) 40 MG tablet TAKE 1 TABLET BY MOUTH DAILY 30 tablet PRN  . chlorthalidone (HYGROTON) 25 MG tablet Take 1 tablet (25 mg total) by mouth daily. 30 tablet 4  . mupirocin nasal ointment (BACTROBAN) 2 % Apply in each nostril daily 10 g 1  . naproxen (NAPROSYN) 500 MG tablet Take 1 tablet (500 mg total) by mouth 2 (two) times daily with a meal. 30 tablet 0   No current facility-administered  medications on file prior to visit.    BP 121/83 mmHg  Pulse 94  Temp(Src) 98.4 F (36.9 C) (Oral)  Ht 5\' 11"  (1.803 m)  Wt 247 lb 9.6 oz (112.311 kg)  BMI 34.55 kg/m2  SpO2 97%        Objective:   Physical Exam  General Mental Status- Alert. General Appearance- Not in acute distress.   Skin General: Color- Normal Color. Moisture- Normal Moisture.  Neck Carotid Arteries- Normal color. Moisture- Normal Moisture. No carotid bruits. No JVD.  Chest and Lung Exam Auscultation: Breath Sounds:-Normal.  Cardiovascular Auscultation:Rythm- Regular. Murmurs & Other Heart Sounds:Auscultation of the heart reveals- No  Murmurs.  Abdomen Inspection:-Inspeection Normal. Palpation/Percussion:Note:No mass. Palpation and Percussion of the abdomen reveal- Non Tender, Non Distended + BS, no rebound or guarding.    Neurologic Cranial Nerve exam:- CN III-XII intact(No nystagmus), symmetric smile. Drift Test:- No drift. Romberg Exam:- Negative.  Heal to Toe Gait exam:-Normal. Finger to Nose:- Normal/Intact Strength:- 5/5 equal and symmetric strength both upper and lower extremities.  Feet- lt heel tender to palpation and achilles tender. Rt heel tender to palpation and some arch but achilles tendon non tender.      Assessment & Plan:  For your htn which is well controlled continue current norvasc. Will check cmp today.  For your lipids will check lipid panel.  For heel pain will get xrays of both feet. You have likely plantar fascitis and recommend. Use heel pad otc and ibuprofen otc. Try to loose some weight as well.  For snoring will refer you to pulmonology for probable sleep apnea.  Follow up in 2-3 weeks or as needs   Jamaiyah Pyle, Ramon DredgeEdward, PA-C

## 2016-05-18 NOTE — Patient Instructions (Addendum)
For your htn which is well controlled continue current norvasc. Will check cmp today.  For your lipids will check lipid panel. Continue lipitor.  For heel pain will get xrays of both feet. You have likely plantar fascitis and recommend. Use heel pad otc and ibuprofen otc. Try to loose some weight as well.  For snoring will refer you to pulmonology for probable sleep apnea.  Follow up in 2-3 weeks or as needs

## 2016-05-18 NOTE — Telephone Encounter (Signed)
kdur sent in. Repeat cmp in 3 wks.

## 2016-05-26 ENCOUNTER — Ambulatory Visit (INDEPENDENT_AMBULATORY_CARE_PROVIDER_SITE_OTHER): Payer: 59 | Admitting: Family

## 2016-05-26 ENCOUNTER — Encounter: Payer: Self-pay | Admitting: Family

## 2016-05-26 ENCOUNTER — Ambulatory Visit (HOSPITAL_BASED_OUTPATIENT_CLINIC_OR_DEPARTMENT_OTHER)
Admission: RE | Admit: 2016-05-26 | Discharge: 2016-05-26 | Disposition: A | Discharge status: Home / Self Care | Payer: 59 | Source: Ambulatory Visit | Attending: Family | Admitting: Family

## 2016-05-26 VITALS — BP 110/80 | HR 74 | Temp 97.9°F | Ht 71.0 in | Wt 253.6 lb

## 2016-05-26 DIAGNOSIS — M79645 Pain in left finger(s): Secondary | ICD-10-CM

## 2016-05-26 DIAGNOSIS — M7989 Other specified soft tissue disorders: Secondary | ICD-10-CM | POA: Diagnosis not present

## 2016-05-26 DIAGNOSIS — S6992XA Unspecified injury of left wrist, hand and finger(s), initial encounter: Secondary | ICD-10-CM

## 2016-05-26 DIAGNOSIS — M79642 Pain in left hand: Secondary | ICD-10-CM | POA: Insufficient documentation

## 2016-05-26 NOTE — Progress Notes (Signed)
Pre visit review using our clinic review tool, if applicable. No additional management support is needed unless otherwise documented below in the visit note. 

## 2016-05-26 NOTE — Progress Notes (Signed)
Subjective:    Patient ID: Robert Cantrell, male    DOB: 27-Dec-1975, 40 y.o.   MRN: [id number]  HPI  Robert Cantrell is a 40 yr old male who presents today to discuss a jammed finger. Reports that injury occurred 2 weeks ago.  Injury occurred to left 3rd and 4th finger.  He reports that he has pain in PIP of both fingers.  He reports that he shoved a meter (for [employer]) and the meter was stuck.  He then re-injured playing football the other day.  He has tried ice and naproxen.   Review of Systems See HPI  Past Medical History  Diagnosis Date  . Hypertension   . Hyperlipidemia      Social History   Social History  . Marital Status: Single    Spouse Name: N/A  . Number of Children: N/A  . Years of Education: N/A   Occupational History  . Not on file.   Social History Main Topics  . Smoking status: Never Smoker   . Smokeless tobacco: Not on file  . Alcohol Use: 0.0 oz/week    0 Standard drinks or equivalent per week     Comment: special occasions only.  . Drug Use: No  . Sexual Activity:    Partners: Female   Other Topics Concern  . Not on file   Social History Narrative    Past Surgical History  Procedure Laterality Date  . Finger surgery      Family History  Problem Relation Age of Onset  . Diabetes Mother   . Hypertension Mother   . Diabetes Father   . Hypertension Father   . Hypertension Sister   . Hypertension Brother   . Diabetes Maternal Grandmother   . Diabetes Maternal Grandfather   . Diabetes Paternal Grandmother   . Diabetes Paternal Grandfather     No Known Allergies  Current Outpatient Prescriptions on File Prior to Visit  Medication Sig Dispense Refill  . amLODipine (NORVASC) 10 MG tablet Take 1 tablet (10 mg total) by mouth daily. 90 tablet 1  . atorvastatin (LIPITOR) 40 MG tablet TAKE 1 TABLET BY MOUTH DAILY 30 tablet PRN  . chlorthalidone (HYGROTON) 25 MG tablet Take 1 tablet (25 mg total) by mouth daily. 30 tablet 4  . naproxen  (NAPROSYN) 500 MG tablet Take 1 tablet (500 mg total) by mouth 2 (two) times daily with a meal. 30 tablet 0  . potassium chloride (K-DUR,KLOR-CON) 10 MEQ tablet Take 1 tablet (10 mEq total) by mouth daily. (Patient not taking: Reported on 05/26/2016) 21 tablet 0   No current facility-administered medications on file prior to visit.    BP 110/80 mmHg  Pulse 74  Temp(Src) 97.9 F (36.6 C) (Oral)  Ht 5\' 11"  (1.803 m)  Wt 253 lb 9.6 oz (115.032 kg)  BMI 35.39 kg/m2  SpO2 98%        Objective:   Physical Exam  Constitutional: He is oriented to person, place, and time. He appears well-developed and well-nourished. No distress.  Musculoskeletal:  + tenderness to palpation of left third/fourth finger PIP's. No significant swelling is noted  Neurological: He is alert and oriented to person, place, and time.  Skin: Skin is warm and dry.  Psychiatric: He has a normal mood and affect. His behavior is normal. Judgment and thought content normal.          Assessment & Plan:  Jammed finger left hand- Will x ray to exclude fracture.  Advised  pt to continue icing, naproxen, let me know if not improved in 1 week and we can plan referral to hand specialist.

## 2016-05-26 NOTE — Patient Instructions (Signed)
Please complete x ray on the first floor.  Continue naproxen 220mg  (1 tab) twice daily for the next few days. You can ice your hand twice daily. Call if pain/swelling worsens or if it is not improved in 1 week and we can get you in with a hand specialist.

## 2016-05-27 ENCOUNTER — Telehealth: Payer: Self-pay | Admitting: *Deleted

## 2016-05-27 DIAGNOSIS — E876 Hypokalemia: Secondary | ICD-10-CM

## 2016-05-27 MED FILL — KLOR-CON M10 TABLET: 10 | 21 days supply | Qty: 21 | Fill #0

## 2016-05-27 NOTE — Telephone Encounter (Signed)
Called and spoke with the pt and he stated that he went to the pharmacy on yesterday and he was told that he had a prescription for potassium to be picked up.  Pt stated that he was not told that he needed the prescription and he did not know what it was for.  Informed the pt that from his recent lab results his potassium level was low and Ramon Dredgedward wanted him to start potassium.  Pt stated that he never got a call regarding his lab results.  Informed the pt that he was given a call and a message was left asking him to give us a call back, but we never heard back from him.  Informed him that a letter was mailed to him with the results and note regarding the results.  Pt verbalized understanding.  Informed the pt of the recent lab results and note.  Pt again verbalized understanding and agreed.  Pt stated that he has not been on the diuretic since May.  He stated that he ran out and he did not get any more.  Informed the pt that I will inform Ramon Dredgedward that he has not been on the diuretic and see what he would like for him to do.  Pt agreed.  Verbally informed Ramon Dredgedward of the conversation with the pt.  Per Ramon DredgeEdward he would like for the pt to start the potassium and recheck CMP in [redacted] weeks along with a BP check with the nurse.  He stated that the pt can stay of the diuretic, but would like for him to check his blood pressure.  If the pt's blood pressure is 140/90 he would like for him to give us a call and we may have to restart the diuretic. Called the pt and informed him of the message from [place].  Pt verbalized understanding and agreed.  Pt stated that he will ask his wife to check his blood pressure.  Pt asked if I would call the pharmacy and let them know that he will pick up the prescription for the potassium.  Pharmacy called.  Pt was scheduled a lab and nurse visit for (Wed-06/17/16 @ 7:15am and 9:00am).  Future lab already ordered.//AB/CMA

## 2016-06-02 ENCOUNTER — Other Ambulatory Visit: Payer: Self-pay

## 2016-06-02 ENCOUNTER — Other Ambulatory Visit: Payer: Self-pay | Admitting: Family Medicine

## 2016-06-02 ENCOUNTER — Telehealth: Payer: Self-pay

## 2016-06-02 DIAGNOSIS — I10 Essential (primary) hypertension: Secondary | ICD-10-CM

## 2016-06-02 MED ORDER — CHLORTHALIDONE 25 MG PO TABS: 25.0000 mg | ORAL_TABLET | Freq: Every day | ORAL | Status: DC

## 2016-06-02 MED ORDER — AMLODIPINE BESYLATE 10 MG PO TABS: 10.0000 mg | ORAL_TABLET | Freq: Every day | ORAL | Status: DC

## 2016-06-02 MED FILL — AMLODIPINE BESYLATE 10 MG T: 10 | 90 days supply | Qty: 90 | Fill #0

## 2016-06-02 NOTE — Telephone Encounter (Signed)
Will refill his chlorthalidone for 30 days. I wanted him to repeat cmp to check his k level next week( Will he schedule appointment next week). His diuretic may be dropping k level. Did he take the k tabs I called in? I think he is on both amlodipine and chorthalidone. I just did not refill his diuretic last visit.

## 2016-06-02 NOTE — Telephone Encounter (Signed)
Rx for amlodipine 10 mg was sent to pharmacy on 06/02/16.

## 2016-06-02 NOTE — Addendum Note (Signed)
Addended by: Mervin KungFERGERSON, Raylie Maddison A on: 06/02/2016 04:18 PM   Modules accepted: Orders, Medications

## 2016-06-02 NOTE — Telephone Encounter (Signed)
Pt is requesting a refill on his Chlorthalidone 25 mg. The last note says you only wanted him on the Norvasc 10 mg once a day and a 90 day supply was sent to the pharmacy 06/02/16. Please advise if you want this pt on both medications.

## 2016-06-02 NOTE — Telephone Encounter (Signed)
Notified pt of below. He states he was previously told to stop Chlorthalidone because his BP was doing so well. States he has not taken chlorthalidone since May and told pharmacy to cancel refill that we sent on 06/02/16. He did pick up Potassium Rx last week and is currently taking it. He has an appt on Monday with PA, Saguier and will do lab at that visit. Pt will remain off Chlorthalidone until further discussion at f/u on 06/08/16. Med list updated.

## 2016-06-08 ENCOUNTER — Encounter: Payer: Self-pay | Admitting: Medical

## 2016-06-08 ENCOUNTER — Ambulatory Visit (INDEPENDENT_AMBULATORY_CARE_PROVIDER_SITE_OTHER): Payer: 59 | Admitting: Medical

## 2016-06-08 VITALS — BP 128/84 | HR 84 | Temp 98.1°F | Ht 71.0 in | Wt 245.6 lb

## 2016-06-08 DIAGNOSIS — Z309 Encounter for contraceptive management, unspecified: Secondary | ICD-10-CM

## 2016-06-08 DIAGNOSIS — M79645 Pain in left finger(s): Secondary | ICD-10-CM

## 2016-06-08 DIAGNOSIS — Z3009 Encounter for other general counseling and advice on contraception: Secondary | ICD-10-CM

## 2016-06-08 NOTE — Progress Notes (Signed)
Subjective:    Patient ID: Robert Cantrell, male    DOB: November 27, 1975, 40 y.o.   MRN: [id number]  HPI  Pt in for evaluation. Pt injured finger about one month ago. He was playing football. Football hit his finger. Had some severe swelling so much that he can't put wedding band on his finger Pt has used ice and naproxen. Reviewed prior note. Ring finger still swollen. 3rd digit faintly sore. It was injured as well but much better.  Pt wants counseling on getting vasectomy.     Review of Systems  Musculoskeletal:       Lt ring finger/4th digit pain and swelling.   Skin: Negative for rash.    Past Medical History:  Diagnosis Date  . Hyperlipidemia   . Hypertension      Social History   Social History  . Marital status: Single    Spouse name: N/A  . Number of children: N/A  . Years of education: N/A   Occupational History  . Not on file.   Social History Main Topics  . Smoking status: Never Smoker  . Smokeless tobacco: Not on file  . Alcohol use 0.0 oz/week     Comment: special occasions only.  . Drug use: No  . Sexual activity: Yes    Partners: Female   Other Topics Concern  . Not on file   Social History Narrative  . No narrative on file    Past Surgical History:  Procedure Laterality Date  . FINGER SURGERY      Family History  Problem Relation Age of Onset  . Diabetes Mother   . Hypertension Mother   . Diabetes Father   . Hypertension Father   . Hypertension Sister   . Hypertension Brother   . Diabetes Maternal Grandmother   . Diabetes Maternal Grandfather   . Diabetes Paternal Grandmother   . Diabetes Paternal Grandfather     No Known Allergies  Current Outpatient Prescriptions on File Prior to Visit  Medication Sig Dispense Refill  . amLODipine (NORVASC) 10 MG tablet Take 1 tablet (10 mg total) by mouth daily. 90 tablet 1  . atorvastatin (LIPITOR) 40 MG tablet TAKE 1 TABLET BY MOUTH DAILY 30 tablet PRN  . naproxen (NAPROSYN) 500 MG tablet  Take 1 tablet (500 mg total) by mouth 2 (two) times daily with a meal. 30 tablet 0  . potassium chloride (K-DUR,KLOR-CON) 10 MEQ tablet Take 1 tablet (10 mEq total) by mouth daily. 21 tablet 0   No current facility-administered medications on file prior to visit.     BP 128/84 (BP Location: Left Arm)   Pulse 84   Temp 98.1 F (36.7 C) (Oral)   Ht  (1.803 m)   Wt 245 lb 9.6 oz (111.4 kg)   SpO2 98%   BMI 34.25 kg/m       Objective:   Physical Exam  General- No acute distress. Pleasant patient.  Lt hand- 3rd digit full range of motion and no obvious swelling. 4th digit PIP joint very swollen. Good range of motion but pain that is severe on extension of the digit.       Assessment & Plan:  For your left finger pain that persist post injury now 4 weeks out will refer you to hand specialist. Evaluate for soft tissue injury since xray was negative.  Will also refer you to urologist for evaluation and advise regarding possible vasectomy.  Did recommend pt to schedule for complete physical within 1-3  months.  Aidyn Sportsman, Ramon Dredge, PA-C

## 2016-06-08 NOTE — Progress Notes (Signed)
Pre visit review using our clinic review tool, if applicable. No additional management support is needed unless otherwise documented below in the visit note. 

## 2016-06-08 NOTE — Patient Instructions (Addendum)
For your left finger pain that persist post injury now 4 weeks out will refer you to hand specialist. Evaluate for soft tissue injury since xray was negative.(Did give some coban and educated on wrapping for support but not to put on too tight as to cut off circulation).  Will also refer you to urologist for evaluation and advise regarding possible vasectomy.

## 2016-06-15 DIAGNOSIS — H5212 Myopia, left eye: Secondary | ICD-10-CM | POA: Diagnosis not present

## 2016-06-15 DIAGNOSIS — H52223 Regular astigmatism, bilateral: Secondary | ICD-10-CM | POA: Diagnosis not present

## 2016-06-15 DIAGNOSIS — H5201 Hypermetropia, right eye: Secondary | ICD-10-CM | POA: Diagnosis not present

## 2016-06-17 ENCOUNTER — Other Ambulatory Visit: Payer: 59

## 2016-07-02 ENCOUNTER — Ambulatory Visit: Payer: 59 | Admitting: Medical

## 2016-07-02 DIAGNOSIS — Z0289 Encounter for other administrative examinations: Secondary | ICD-10-CM

## 2016-07-16 ENCOUNTER — Ambulatory Visit (INDEPENDENT_AMBULATORY_CARE_PROVIDER_SITE_OTHER): Payer: 59 | Admitting: Medical

## 2016-07-16 ENCOUNTER — Telehealth: Payer: Self-pay | Admitting: Medical

## 2016-07-16 ENCOUNTER — Encounter: Payer: Self-pay | Admitting: Medical

## 2016-07-16 VITALS — BP 138/85 | HR 74 | Temp 97.8°F | Ht 71.0 in | Wt 250.0 lb

## 2016-07-16 DIAGNOSIS — S6990XD Unspecified injury of unspecified wrist, hand and finger(s), subsequent encounter: Secondary | ICD-10-CM | POA: Diagnosis not present

## 2016-07-16 DIAGNOSIS — I10 Essential (primary) hypertension: Secondary | ICD-10-CM

## 2016-07-16 DIAGNOSIS — E876 Hypokalemia: Secondary | ICD-10-CM | POA: Diagnosis not present

## 2016-07-16 DIAGNOSIS — R0683 Snoring: Secondary | ICD-10-CM | POA: Diagnosis not present

## 2016-07-16 DIAGNOSIS — E785 Hyperlipidemia, unspecified: Secondary | ICD-10-CM | POA: Diagnosis not present

## 2016-07-16 LAB — COMPREHENSIVE METABOLIC PANEL
ALT: 26 U/L (ref 0–53)
AST: 20 U/L (ref 0–37)
Albumin: 4.3 g/dL (ref 3.5–5.2)
Alkaline Phosphatase: 76 U/L (ref 39–117)
BUN: 9 mg/dL (ref 6–23)
CO2: 31 meq/L (ref 19–32)
Calcium: 9.1 mg/dL (ref 8.4–10.5)
Chloride: 104 mEq/L (ref 96–112)
Creatinine, Ser: 0.94 mg/dL (ref 0.40–1.50)
GFR: 114.93 mL/min (ref >60.00)
GLUCOSE: 112 mg/dL — AB (ref 70–99)
POTASSIUM: 3.4 meq/L — AB (ref 3.5–5.1)
Sodium: 139 mEq/L (ref 135–145)
Total Bilirubin: 0.4 mg/dL (ref 0.2–1.2)
Total Protein: 7.4 g/dL (ref 6.0–8.3)

## 2016-07-16 MED ORDER — POTASSIUM CHLORIDE CRYS ER 10 MEQ PO TBCR: EXTENDED_RELEASE_TABLET | ORAL | 0 refills | Status: AC

## 2016-07-16 MED ORDER — POTASSIUM CHLORIDE CRYS ER 10 MEQ PO TBCR: 10.0000 meq | EXTENDED_RELEASE_TABLET | Freq: Every day | ORAL | 0 refills | Status: DC

## 2016-07-16 NOTE — Progress Notes (Signed)
Pre visit review using our clinic tool,if applicable. No additional management support is needed unless otherwise documented below in the visit note.  

## 2016-07-16 NOTE — Progress Notes (Signed)
Subjective:    Patient ID: Robert Cantrell, male    DOB: 10/09/1976, 40 y.o.   MRN: [id number]  HPI  Pt in stating he did se hand specialist. Pt states hand specialist stated stressed ligaments. No fx seen by specialist. Pt did not think necessary to follow up with specialist.   Pt has yet to see urologist regarding vasectomy. His wife had procedure sounds like tubal ligation or hysterectomy. So pt won't see urologist.  Pt still is snoring as before. One of my notes stated I referred to pulmonolgist. He will go to that when scheduled.  Pt is still taking bp medication.   Pt will do flu vaccine mid sept or early October.       Review of Systems  Constitutional: Negative for chills, fatigue and fever.  Respiratory: Negative for cough, chest tightness, shortness of breath and wheezing.        Still snoring.  Cardiovascular: Negative for chest pain and palpitations.  Gastrointestinal: Negative for abdominal pain, diarrhea and nausea.  Genitourinary: Negative for dysuria and frequency.  Musculoskeletal: Negative for back pain.       Finger is feeling better.  Skin: Negative for rash.  Neurological: Negative for dizziness, seizures, syncope, weakness and light-headedness.  Hematological: Negative for adenopathy. Does not bruise/bleed easily.  Psychiatric/Behavioral: Negative for behavioral problems and confusion.    Past Medical History:  Diagnosis Date  . Hyperlipidemia   . Hypertension      Social History   Social History  . Marital status: Single    Spouse name: N/A  . Number of children: N/A  . Years of education: N/A   Occupational History  . Not on file.   Social History Main Topics  . Smoking status: Never Smoker  . Smokeless tobacco: Not on file  . Alcohol use 0.0 oz/week     Comment: special occasions only.  . Drug use: No  . Sexual activity: Yes    Partners: Female   Other Topics Concern  . Not on file   Social History Narrative  . No narrative  on file    Past Surgical History:  Procedure Laterality Date  . FINGER SURGERY      Family History  Problem Relation Age of Onset  . Diabetes Mother   . Hypertension Mother   . Diabetes Father   . Hypertension Father   . Hypertension Sister   . Hypertension Brother   . Diabetes Maternal Grandmother   . Diabetes Maternal Grandfather   . Diabetes Paternal Grandmother   . Diabetes Paternal Grandfather     No Known Allergies  Current Outpatient Prescriptions on File Prior to Visit  Medication Sig Dispense Refill  . amLODipine (NORVASC) 10 MG tablet Take 1 tablet (10 mg total) by mouth daily. 90 tablet 1  . atorvastatin (LIPITOR) 40 MG tablet TAKE 1 TABLET BY MOUTH DAILY 30 tablet PRN  . naproxen (NAPROSYN) 500 MG tablet Take 1 tablet (500 mg total) by mouth 2 (two) times daily with a meal. (Patient not taking: Reported on 07/16/2016) 30 tablet 0  . potassium chloride (K-DUR,KLOR-CON) 10 MEQ tablet Take 1 tablet (10 mEq total) by mouth daily. (Patient not taking: Reported on 07/16/2016) 21 tablet 0   No current facility-administered medications on file prior to visit.     BP (!) 126/93   Pulse 74   Temp 97.8 F (36.6 C) (Oral)   Ht 5\' 11"  (1.803 m)   Wt 250 lb (113.4 kg)   SpO2  99%   BMI 34.87 kg/m       Objective:   Physical Exam  General  Mental Status - Alert. General Appearance - Well groomed. Not in acute distress.  Skin Rashes- No Rashes.  HEENT Head- Normal. Ear Auditory Canal - Left- Normal. Right - Normal.Tympanic Membrane- Left- Normal. Right- Normal. Eye Sclera/Conjunctiva- Left- Normal. Right- Normal. Nose & Sinuses Nasal Mucosa- Left- Not Boggy and Congested. Right-  Not Boggy and  Congested.Bilateral maxillary and frontal sinus pressure.   Neck Neck- Supple. No Masses.   Chest and Lung Exam Auscultation: Breath Sounds:-Clear even and unlabored.  Cardiovascular Auscultation:Rythm- Regular, rate and rhythm. Murmurs & Other Heart  Sounds:Ausculatation of the heart reveal- No Murmurs.  Lymphatic Head & Neck General Head & Neck Lymphatics: Bilateral: Description- No Localized lymphadenopathy.  Neuro- CN III- XII grossly intact.  Lt ring finger- less swelling pip joint. Good/nml rom.      Assessment & Plan:  For bp continue your norvasc. Controlled today.  Glad finger pain is better and you saw specialist.  For snoring see specialist in September.  Continue lipid medication, diet and exercise.   Hx of low potassium. Recheck today.  Follow up early to mid October for fasting physical exam.   Esperanza RichtersSaguier, Crissie Aloi, PA-C

## 2016-07-16 NOTE — Telephone Encounter (Signed)
I sent in rx potassium. Notify pt. Instruction is 1 tab every other day. Will you call down to pharmacy and let them now to fill 45 tabs. Looks like 2 rx of potassium may have been sent?

## 2016-07-16 NOTE — Addendum Note (Signed)
Addended by: Gwenevere AbbotSAGUIER, Shamiracle Gorden M on: 07/16/2016 09:26 AM   Modules accepted: Orders

## 2016-07-16 NOTE — Telephone Encounter (Signed)
Sugar 112. Mild elevated. Low sugar diet. Repeat on physical. If elevated then will consider metformin. Discuss on physical.

## 2016-07-16 NOTE — Patient Instructions (Addendum)
For bp continue your norvasc. Controlled today.  Glad finger pain is better and you saw specialist.  For snoring see specialist in September.  Continue lipid medication, diet and exercise.   Hx of low potassium. Recheck today.  Follow up early to mid October for fasting physical exam.

## 2016-07-17 NOTE — Telephone Encounter (Signed)
Called patient unable to reach. Continuous busy signal.

## 2016-07-22 NOTE — Telephone Encounter (Signed)
Spoke w/ Pt, informed him of lab results and recommendations. Pt verbalized understanding.

## 2016-07-23 ENCOUNTER — Institutional Professional Consult (permissible substitution): Payer: 59 | Admitting: Pulmonary Disease

## 2016-07-27 MED FILL — ATORVASTATIN 40 MG TABLET: 40 | 90 days supply | Qty: 90 | Fill #2

## 2016-07-27 MED FILL — KLOR-CON M10 TABLET: 10 | 21 days supply | Qty: 21 | Fill #0

## 2016-08-14 ENCOUNTER — Encounter: Payer: Self-pay | Admitting: Medical

## 2016-08-14 ENCOUNTER — Ambulatory Visit (INDEPENDENT_AMBULATORY_CARE_PROVIDER_SITE_OTHER): Payer: 59 | Admitting: Medical

## 2016-08-14 VITALS — BP 120/80 | HR 74 | Temp 98.1°F | Ht 71.0 in | Wt 252.8 lb

## 2016-08-14 DIAGNOSIS — I10 Essential (primary) hypertension: Secondary | ICD-10-CM

## 2016-08-14 DIAGNOSIS — Z23 Encounter for immunization: Secondary | ICD-10-CM

## 2016-08-14 DIAGNOSIS — E876 Hypokalemia: Secondary | ICD-10-CM

## 2016-08-14 DIAGNOSIS — E785 Hyperlipidemia, unspecified: Secondary | ICD-10-CM

## 2016-08-14 DIAGNOSIS — R0683 Snoring: Secondary | ICD-10-CM | POA: Diagnosis not present

## 2016-08-14 LAB — COMPREHENSIVE METABOLIC PANEL
ALK PHOS: 70 U/L (ref 39–117)
ALT: 18 U/L (ref 0–53)
AST: 14 U/L (ref 0–37)
Albumin: 4.2 g/dL (ref 3.5–5.2)
BILIRUBIN TOTAL: 0.4 mg/dL (ref 0.2–1.2)
BUN: 12 mg/dL (ref 6–23)
CALCIUM: 8.8 mg/dL (ref 8.4–10.5)
CO2: 29 mEq/L (ref 19–32)
Chloride: 105 mEq/L (ref 96–112)
Creatinine, Ser: 0.93 mg/dL (ref 0.40–1.50)
GFR: 116.31 mL/min (ref >60.00)
Glucose, Bld: 106 mg/dL — ABNORMAL HIGH (ref 70–99)
Potassium: 3.6 mEq/L (ref 3.5–5.1)
Sodium: 140 mEq/L (ref 135–145)
TOTAL PROTEIN: 7.2 g/dL (ref 6.0–8.3)

## 2016-08-14 MED ORDER — ATORVASTATIN CALCIUM 40 MG PO TABS: 40.0000 mg | ORAL_TABLET | Freq: Every day | ORAL | 1 refills | Status: DC

## 2016-08-14 MED ORDER — AMLODIPINE BESYLATE 10 MG PO TABS: 10.0000 mg | ORAL_TABLET | Freq: Every day | ORAL | 1 refills | Status: DC

## 2016-08-14 NOTE — Progress Notes (Signed)
Subjective:    Patient ID: Robert Cantrell, male    DOB: [birth year], 40 y.o.   MRN: [id number]  HPI  Pt in for follow up.   Pt has not taken bp med for one week. Pt has not bee checking his bp. Pt states he thinks has bp cuff at home but not sure.   Pt states in past would usually take his bp but he has been real busy at work. Getting up so early to get to work that he forgot.   Pt then states he actually took bp med this morning.   Pt needs refills since [place].   Pt k was low in the past. He was taking med every other day kdur but not this week. Hx of low k.   Pt has history of snoring. Refered to pulmonologist appointment. He missed scheduled appointment. His interview conflicted with that appointment. I asked him to call and reschedule.  Pt want flu shot this week.  Pt on statin. He is fasting today.     Review of Systems  Constitutional: Negative for chills, fatigue and fever.  Respiratory: Negative for cough, chest tightness, shortness of breath and wheezing.   Cardiovascular: Negative for chest pain and palpitations.  Gastrointestinal: Negative for abdominal distention, abdominal pain, blood in stool, diarrhea, nausea and vomiting.  Musculoskeletal: Negative for back pain.  Skin: Negative for rash.  Neurological: Negative for dizziness, speech difficulty, weakness, numbness and headaches.  Hematological: Negative for adenopathy. Does not bruise/bleed easily.  Psychiatric/Behavioral: Negative for behavioral problems and confusion.   Past Medical History:  Diagnosis Date  . Hyperlipidemia   . Hypertension      Social History   Social History  . Marital status: Single    Spouse name: N/A  . Number of children: N/A  . Years of education: N/A   Occupational History  . Not on file.   Social History Main Topics  . Smoking status: Never Smoker  . Smokeless tobacco: Not on file  . Alcohol use 0.0 oz/week     Comment: special occasions  only.  . Drug use: No  . Sexual activity: Yes    Partners: Female   Other Topics Concern  . Not on file   Social History Narrative  . No narrative on file    Past Surgical History:  Procedure Laterality Date  . FINGER SURGERY      Family History  Problem Relation Age of Onset  . Diabetes Mother   . Hypertension Mother   . Diabetes Father   . Hypertension Father   . Hypertension Sister   . Hypertension Brother   . Diabetes Maternal Grandmother   . Diabetes Maternal Grandfather   . Diabetes Paternal Grandmother   . Diabetes Paternal Grandfather     No Known Allergies  Current Outpatient Prescriptions on File Prior to Visit  Medication Sig Dispense Refill  . amLODipine (NORVASC) 10 MG tablet Take 1 tablet (10 mg total) by mouth daily. 90 tablet 1  . atorvastatin (LIPITOR) 40 MG tablet TAKE 1 TABLET BY MOUTH DAILY 30 tablet PRN  . naproxen (NAPROSYN) 500 MG tablet Take 1 tablet (500 mg total) by mouth 2 (two) times daily with a meal. 30 tablet 0  . potassium chloride (K-DUR,KLOR-CON) 10 MEQ tablet 1 tab po every other day 45 tablet 0   No current facility-administered medications on file prior to visit.     BP 120/80 Comment: rt arms sitting. ESPAC.  Pulse 74   Temp 98.1 F (36.7 C) (Oral)   Ht 5\' 11"  (1.803 m)   Wt 252 lb 12.8 oz (114.7 kg)   SpO2 98%   BMI 35.26 kg/m       Objective:   Physical Exam  General Mental Status- Alert. General Appearance- Not in acute distress.   Chest and Lung Exam Auscultation: Breath Sounds:-Normal.  Cardiovascular Auscultation:Rythm- Regular. Murmurs & Other Heart Sounds:Auscultation of the heart reveals- No Murmurs.   Neurologic Cranial Nerve exam:- CN III-XII intact(No nystagmus), symmetric smile. Strength:- 5/5 equal and symmetric strength both upper and lower extremities.     Assessment & Plan:  Your bp is well controlled today. I will refill your bp medications today. Please take med daily basis. Also get  bp cuff and check bp occasionally.  For your high cholesterol will recheck that in spring since last checked looked very good except mild low hdl. Exercise will increase hdl.  For low potassium will recheck your level today.  For snoring please call pulmonologist office and reschedule when you can.  Follow up in January or as needed  We gave you flu vaccine today.  Lonnetta Kniskern, Ramon Dredge, PA-C

## 2016-08-14 NOTE — Progress Notes (Signed)
Pre visit review using our clinic tool,if applicable. No additional management support is needed unless otherwise documented below in the visit note.  

## 2016-08-14 NOTE — Patient Instructions (Addendum)
Your bp is well controlled today. I will refill your bp medications today. Please take  Med daily basis. Also get bp cuff and check bp occasionally.  For your high cholesterol will recheck that in spring since last checked looked very good except mild low hdl. Exercise will increase hdl.  For low potassium will recheck your level today.  For snoring please call pulmonologist office and reschedule when you can.  Follow up in January or as needed  We gave you flu vaccine today.

## 2016-08-15 ENCOUNTER — Telehealth: Payer: Self-pay | Admitting: Medical

## 2016-08-15 MED ORDER — METFORMIN HCL 500 MG PO TABS: 500.0000 mg | ORAL_TABLET | Freq: Two times a day (BID) | ORAL | 3 refills | Status: DC

## 2016-08-15 NOTE — Telephone Encounter (Signed)
rx metformin sent to pharmacy. 

## 2016-08-17 MED FILL — metFORMIN HCL 500 MG TABS: 500 | 30 days supply | Qty: 60 | Fill #0

## 2016-08-18 MED FILL — ATORVASTATIN 40 MG TABLET: 40 | 30 days supply | Qty: 30 | Fill #0

## 2016-08-18 MED FILL — AMLODIPINE BESYLATE 10 MG T: 10 | 90 days supply | Qty: 90 | Fill #0

## 2016-08-27 ENCOUNTER — Ambulatory Visit: Payer: 59 | Admitting: Medical

## 2016-12-01 ENCOUNTER — Ambulatory Visit: Payer: Self-pay | Admitting: Medical

## 2016-12-18 MED FILL — ATORVASTATIN 40 MG TABLET: 40 | 30 days supply | Qty: 30 | Fill #3

## 2016-12-18 MED FILL — AMLODIPINE BESYLATE 10 MG T: 10 | 90 days supply | Qty: 90 | Fill #1

## 2017-02-04 MED FILL — AMLODIPINE BESYLATE 10 MG T: 10 | 90 days supply | Qty: 90 | Fill #1

## 2017-02-04 MED FILL — ATORVASTATIN 40 MG TABLET: 40 | 30 days supply | Qty: 30 | Fill #1

## 2017-02-25 ENCOUNTER — Ambulatory Visit (INDEPENDENT_AMBULATORY_CARE_PROVIDER_SITE_OTHER): Payer: Self-pay | Admitting: Medical

## 2017-02-25 ENCOUNTER — Ambulatory Visit (HOSPITAL_BASED_OUTPATIENT_CLINIC_OR_DEPARTMENT_OTHER)
Admission: RE | Admit: 2017-02-25 | Discharge: 2017-02-25 | Disposition: A | Discharge status: Home / Self Care | Payer: Self-pay | Source: Ambulatory Visit | Attending: Medical | Admitting: Medical

## 2017-02-25 VITALS — BP 131/83 | HR 75 | Temp 98.8°F | Resp 16 | Ht 73.0 in | Wt 252.2 lb

## 2017-02-25 DIAGNOSIS — E785 Hyperlipidemia, unspecified: Secondary | ICD-10-CM

## 2017-02-25 DIAGNOSIS — M799 Soft tissue disorder, unspecified: Secondary | ICD-10-CM | POA: Insufficient documentation

## 2017-02-25 DIAGNOSIS — E118 Type 2 diabetes mellitus with unspecified complications: Secondary | ICD-10-CM

## 2017-02-25 DIAGNOSIS — M25562 Pain in left knee: Secondary | ICD-10-CM

## 2017-02-25 DIAGNOSIS — I10 Essential (primary) hypertension: Secondary | ICD-10-CM

## 2017-02-25 LAB — COMPREHENSIVE METABOLIC PANEL
ALBUMIN: 4.4 g/dL (ref 3.5–5.2)
ALT: 17 U/L (ref 0–53)
AST: 16 U/L (ref 0–37)
Alkaline Phosphatase: 84 U/L (ref 39–117)
BILIRUBIN TOTAL: 0.5 mg/dL (ref 0.2–1.2)
BUN: 15 mg/dL (ref 6–23)
CALCIUM: 9.4 mg/dL (ref 8.4–10.5)
CO2: 28 mEq/L (ref 19–32)
CREATININE: 0.95 mg/dL (ref 0.40–1.50)
Chloride: 106 mEq/L (ref 96–112)
GFR: 113.18 mL/min (ref >60.00)
Glucose, Bld: 113 mg/dL — ABNORMAL HIGH (ref 70–99)
Potassium: 3.6 mEq/L (ref 3.5–5.1)
Sodium: 140 mEq/L (ref 135–145)
TOTAL PROTEIN: 7.5 g/dL (ref 6.0–8.3)

## 2017-02-25 LAB — LIPID PANEL
CHOL/HDL RATIO: 4
Cholesterol: 143 mg/dL (ref 0–200)
HDL: 32.2 mg/dL — ABNORMAL LOW (ref >39.00)
LDL Cholesterol: 95 mg/dL (ref 0–99)
NonHDL: 111.14
TRIGLYCERIDES: 80 mg/dL (ref 0.0–149.0)
VLDL: 16 mg/dL (ref 0.0–40.0)

## 2017-02-25 LAB — HEMOGLOBIN A1C: Hgb A1c MFr Bld: 6.6 % — ABNORMAL HIGH (ref 4.6–6.5)

## 2017-02-25 NOTE — Patient Instructions (Addendum)
Your bp is well controlled today. Continue amlodipine.  We are checking your lipid panel today. Recommend better diet low cholesterol diet and more exercise.   Regarding your high blood sugars in past recommend low sugar diet and start to exercise.  Follow up date to be determined. Depending on lab results may be 3-6 months. Or follow up as needed  For your left knee pain continue to use brace and will get xray today. For knee pain follow up in 2 weeks or sooner if needed

## 2017-02-25 NOTE — Progress Notes (Signed)
Pre visit review using our clinic review tool, if applicable. No additional management support is needed unless otherwise documented below in the visit note. 

## 2017-02-25 NOTE — Progress Notes (Signed)
Subjective:    Patient ID: Robert Cantrell, male    DOB: August 25, 1976, 41 y.o.   MRN: [id number]  HPI  Pt in for follow up.   Pt bp is good today. No cardiac or neurologic signs or symptoms.  Pt does not check his bp.  Pt has not been exercising. Pt not eating healthy. Pt has hx of hyperlipidemia. Pt still on atorvastin.  Pt has not been taking his metformin. He never got refills and reported no side.  Pt states injury to left knee February 08 2017. He states injured knee at work. He stepped up on curve at work. He had some boxes in hand he was sent to work comp doctor. No xray one. Pain still present but residual. No xray done at time of evaluation.    Review of Systems  Constitutional: Negative for chills, fatigue and fever.  Respiratory: Negative for cough, chest tightness and wheezing.   Cardiovascular: Negative for chest pain and palpitations.  Gastrointestinal: Negative for abdominal pain and anal bleeding.  Endocrine: Negative for polydipsia and polyphagia.  Musculoskeletal:       Knee pain  Neurological: Negative for dizziness, weakness and headaches.  Hematological: Negative for adenopathy. Does not bruise/bleed easily.  Psychiatric/Behavioral: Negative for behavioral problems and confusion.    Past Medical History:  Diagnosis Date  . Hyperlipidemia   . Hypertension      Social History   Social History  . Marital status: Single    Spouse name: N/A  . Number of children: N/A  . Years of education: N/A   Occupational History  . Not on file.   Social History Main Topics  . Smoking status: Never Smoker  . Smokeless tobacco: Not on file  . Alcohol use 0.0 oz/week     Comment: special occasions only.  . Drug use: No  . Sexual activity: Yes    Partners: Female   Other Topics Concern  . Not on file   Social History Narrative  . No narrative on file    Past Surgical History:  Procedure Laterality Date  . FINGER SURGERY      Family History  Problem  Relation Age of Onset  . Diabetes Mother   . Hypertension Mother   . Diabetes Father   . Hypertension Father   . Hypertension Sister   . Hypertension Brother   . Diabetes Maternal Grandmother   . Diabetes Maternal Grandfather   . Diabetes Paternal Grandmother   . Diabetes Paternal Grandfather     No Known Allergies  Current Outpatient Prescriptions on File Prior to Visit  Medication Sig Dispense Refill  . amLODipine (NORVASC) 10 MG tablet Take 1 tablet (10 mg total) by mouth daily. 90 tablet 1  . atorvastatin (LIPITOR) 40 MG tablet Take 1 tablet (40 mg total) by mouth daily. 90 tablet 1  . metFORMIN (GLUCOPHAGE) 500 MG tablet Take 1 tablet (500 mg total) by mouth 2 (two) times daily with a meal. (Patient not taking: Reported on 02/25/2017) 60 tablet 3  . naproxen (NAPROSYN) 500 MG tablet Take 1 tablet (500 mg total) by mouth 2 (two) times daily with a meal. (Patient not taking: Reported on 02/25/2017) 30 tablet 0  . potassium chloride (K-DUR,KLOR-CON) 10 MEQ tablet 1 tab po every other day (Patient not taking: Reported on 02/25/2017) 45 tablet 0   No current facility-administered medications on file prior to visit.     BP 131/83 (BP Location: Left Arm, Patient Position: Sitting, Cuff Size: Normal)  Pulse 75   Temp 98.8 F (37.1 C) (Oral)   Resp 16   Ht  (1.854 m)   Wt 252 lb 3.2 oz (114.4 kg)   SpO2 99%   BMI 33.27 kg/m       Objective:   Physical Exam   General- No acute distress. Pleasant patient. Neck- Full range of motion, no jvd Lungs- Clear, even and unlabored. Heart- regular rate and rhythm. Neurologic- CNII- XII grossly intact.  Lower ext- see quality metrics  Left knee- when extends no pain. When flexes proximal knee feels stiff. Some pain on palpation medial aspect over medial tibial plateau.      Assessment & Plan:  Your bp is well controlled today. Continue amlodipine.  We are checking your lipid panel today. Recommend better diet low  cholesterol diet and more exercise.   Regarding your high blood sugars in past recommend low sugar diet and start to exercise.  Follow up date to be determined. Depending on lab results may be 3-6 months. Or follow up as needed  For your left knee pain continue to use brace and will get xray today. For knee pain follow up in 2 weeks or sooner if needed

## 2017-02-26 ENCOUNTER — Telehealth: Payer: Self-pay | Admitting: Medical

## 2017-02-26 MED ORDER — METFORMIN HCL 500 MG PO TABS: 500.0000 mg | ORAL_TABLET | Freq: Two times a day (BID) | ORAL | 3 refills | Status: AC

## 2017-02-26 NOTE — Telephone Encounter (Signed)
rx refill metformin sent in.

## 2017-03-03 ENCOUNTER — Telehealth: Payer: Self-pay | Admitting: Medical

## 2017-03-03 DIAGNOSIS — M25562 Pain in left knee: Secondary | ICD-10-CM

## 2017-03-03 NOTE — Telephone Encounter (Signed)
°  Relation to pt: self Call back [phone]   Reason for call:  Patient was seen 02/25/17 by Ramon Dredge for knee pain , patient states PCP advised to inform PA if knee pain didn't improve, patient would like to speak with Ramon Dredge in need of clinical advice

## 2017-03-04 NOTE — Telephone Encounter (Signed)
Pt states that his knee is not any better. 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 Pt was wrote out of work until 03/04/17. Pt states he can't go back to work until he sees ortho.  Please Advise.

## 2017-03-04 NOTE — Telephone Encounter (Signed)
Notified pt note is ready for pick up at front desk.

## 2017-03-04 NOTE — Telephone Encounter (Signed)
Left pt a message to call back. 

## 2017-03-04 NOTE — Telephone Encounter (Signed)
Referral to ortho placed. Notify pt. Today busy day so can't call back but will you see what he needs.

## 2017-03-25 MED FILL — ATORVASTATIN 40 MG TABLET: 40 | 30 days supply | Qty: 30 | Fill #2

## 2017-04-01 ENCOUNTER — Ambulatory Visit (INDEPENDENT_AMBULATORY_CARE_PROVIDER_SITE_OTHER): Payer: Self-pay | Admitting: Orthopaedic Surgery

## 2017-04-01 ENCOUNTER — Encounter (INDEPENDENT_AMBULATORY_CARE_PROVIDER_SITE_OTHER): Payer: Self-pay | Admitting: Orthopaedic Surgery

## 2017-04-01 VITALS — BP 127/87 | HR 80 | Resp 14 | Ht 73.5 in | Wt 256.0 lb

## 2017-04-01 DIAGNOSIS — M25562 Pain in left knee: Secondary | ICD-10-CM

## 2017-04-01 NOTE — Progress Notes (Signed)
Office Visit Note   Patient: Robert Cantrell           Date of Birth: 01-Nov-1976           MRN: [id number] Visit Date: 04/01/2017              Requested by: Esperanza RichtersSaguier, Edward, PA-C [address] [place], [place] [id number] PCP: Esperanza RichtersSaguier, Edward, PA-C   Assessment & Plan: Visit Diagnoses:  1. Left knee pain, unspecified chronicity   Several diagnostic possibilities for left knee pain after an injury. These would include tear of the anterior cruciate ligament, tear of the medial meniscus or possibly a bone bruise  Plan:MRI scan left knee  Follow-Up Instructions: Return after MRI left knee.   Orders:  Orders Placed This Encounter  Procedures  . MR Knee Left w/o contrast   No orders of the defined types were placed in this encounter.     Procedures: No procedures performed   Clinical Data: No additional findings.   Subjective: Chief Complaint  Patient presents with  . Left Knee - Pain  . Knee Pain    Left knee pain since 02/07/17, fell at work, stepped on curb, knee popped, swelling, worker's comp refuses MRI/lawyer suggested pay out of pocket, difficulty walking, limping, stopped wearing brace due to ankle swelling, popping noises, BC, Excederin for pain helps, [facility]   Mr. Robert Cantrell is 41 years old history office evaluation of an injury sustained to his left knee while working. He has 2 jobs. The first is with the [place] [employer]. He notes that that he did not injure himself on that job and continues to work for the city since his accident. At the time of his accident he was also working a second job for [employer], [employer]. 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 He felt as if "my left knee went out". He had immediate onset of swelling He was eventually sent [facility] urgent [facility] center. A knee brace was applied; an MRI scan was  apparently ordered. He now notes a workman's comp has denied his claim. He has an Pensions consultantattorney. He has not worked that second job since the injury. He continues to complain of sensation of his knee giving way. HPI  Review of Systems   Objective: Vital Signs: BP 127/87 (BP Location: Left Arm, Patient Position: Sitting, Cuff Size: Normal)   Pulse 80   Resp 14   Ht 6' 1.5" (1.867 m)   Wt 256 lb (116.1 kg)   BMI 33.32 kg/m   Physical Exam  Ortho Exam left knee exam without evidence of an effusion. No popliteal pain or mass. No calf pain. Neurovascular exam intact distally. No opening with a varus and valgus stress. No pain over either LCL or MCL. No patella pain. Mild medial joint and lateral joint discomfort both anteriorly and posteriorly. Negative anterior drawer sign. Negative Lachman's test.  Specialty Comments:  No specialty comments available.  Imaging: No results found.   PMFS History: Patient Active Problem List   Diagnosis Date Noted  . 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 Left shoulder pain 02/26/2016  . Elevated uric acid in blood 09/23/2015  . Dyslipidemia 09/23/2015  . HTN (hypertension) 10/19/2014  . H/O phimosis 08/30/2014  . Elevated fasting blood sugar 08/06/2014  . HLD (hyperlipidemia) 08/06/2014   Past Medical History:  Diagnosis  Date  . Hyperlipidemia   . Hypertension     Family History  Problem Relation Age of Onset  . Diabetes Mother   . Hypertension Mother   . Diabetes Father   . Hypertension Father   . Hypertension Sister   . Hypertension Brother   . Diabetes Maternal Grandmother   . Diabetes Maternal Grandfather   . Diabetes Paternal Grandmother   . Diabetes Paternal Grandfather     Past Surgical History:  Procedure Laterality Date  . FINGER SURGERY     Social History   Occupational History  . Not on file.   Social History Main Topics  . Smoking status: Never Smoker  . Smokeless tobacco: Never Used  . Alcohol use 0.0 oz/week      Comment: special occasions only.  . Drug use: No  . Sexual activity: Yes    Partners: Female     Robert Batman, MD   Note - This record has been created using [employer].  Chart creation errors have been sought, but may not always  have been located. Such creation errors do not reflect on  the standard of medical care.

## 2017-04-02 ENCOUNTER — Ambulatory Visit (INDEPENDENT_AMBULATORY_CARE_PROVIDER_SITE_OTHER): Payer: Self-pay | Admitting: Orthopaedic Surgery

## 2017-04-16 ENCOUNTER — Ambulatory Visit
Admission: RE | Admit: 2017-04-16 | Discharge: 2017-04-16 | Disposition: A | Discharge status: Home / Self Care | Payer: No Typology Code available for payment source | Source: Ambulatory Visit | Attending: Orthopaedic Surgery | Admitting: Orthopaedic Surgery

## 2017-04-16 DIAGNOSIS — M25562 Pain in left knee: Secondary | ICD-10-CM

## 2017-04-19 ENCOUNTER — Ambulatory Visit (INDEPENDENT_AMBULATORY_CARE_PROVIDER_SITE_OTHER): Payer: Self-pay | Admitting: Orthopaedic Surgery

## 2017-04-19 ENCOUNTER — Encounter (INDEPENDENT_AMBULATORY_CARE_PROVIDER_SITE_OTHER): Payer: Self-pay | Admitting: Orthopaedic Surgery

## 2017-04-19 VITALS — BP 133/80 | HR 58 | Ht 70.0 in | Wt 258.0 lb

## 2017-04-19 DIAGNOSIS — M25562 Pain in left knee: Secondary | ICD-10-CM

## 2017-04-19 NOTE — Progress Notes (Signed)
Office Visit Note   Patient: Robert Cantrell           Date of Birth: 1976-09-18           MRN: [id number] Visit Date: 04/19/2017              Requested by: Esperanza Richters, PA-C [address] [place], [place] [address] PCP: Esperanza Richters, PA-C   Assessment & Plan: Visit Diagnoses:  1. 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 Ligaments are intact  Plan:On-the-job injury left knee as previously outlined. There is a small radial tear of the posterior horn of the medial meniscus. 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 We have discussed different treatment options. At this point I would still be given a little bit more time we can consider cortisone injection even arthroscopic evaluation at some point but I don't think that he is symptomatic enough or compromise to the point where I think either of the tumor is necessary is a reasonable chance that given the little bit more time he might even continue to improve. We'll plan to see again in 6 weeks  Follow-Up Instructions: Return in about 6 weeks (around 05/31/2017).   Orders:  No orders of the defined types were placed in this encounter.  No orders of the defined types were placed in this encounter.     Procedures: No procedures performed   Clinical Data: No additional findings.   Subjective: Chief Complaint  Patient presents with  . Left Knee - Results    Robert Cantrell is a 41 y o that is here for results review of MRI Left knee. He relates his L knee swells and is tender, slightly warm to touch. Wearing brace and his Left ankle is swelling.  Robert Cantrell is status post left knee injury while on one of his 2 jobs. He has an MRI scan based on his injuries that demonstrates a small radial tear of the medial meniscus associated with  chondromalacia of the medial lateral compartment he does well during the day. However, when he is on his feet for a length of time he will have some swelling.  HPI  Review of Systems   Objective: Vital Signs: BP 133/80   Pulse (!) 58   Ht 5\' 10"  (1.778 m)   Wt 258 lb (117 kg)   BMI 37.02 kg/m   Physical Exam  Ortho Exam Left knee without effusion. It is early in the morning. No instability with a varus or valgus stress. Negative anterior drawer sign. No significant medial lateral joint pain. No patella crepitation. Full extension and flexion compared to the right knee. Collateral ligaments intact clinically anterior cruciate ligament and PCL are intact. No distal edema.  neurovascular Intact Specialty Comments:  No specialty comments available.  Imaging: No results found.   PMFS History: Patient Active Problem List   Diagnosis Date Noted  . 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 Left shoulder pain 02/26/2016  . Elevated uric acid in blood 09/23/2015  . Dyslipidemia 09/23/2015  . HTN (hypertension) 10/19/2014  . H/O phimosis 08/30/2014  . Elevated fasting blood sugar 08/06/2014  . HLD (hyperlipidemia) 08/06/2014   Past Medical History:  Diagnosis Date  . Hyperlipidemia   . Hypertension     Family History  Problem Relation Age of Onset  . Diabetes  Mother   . Hypertension Mother   . Diabetes Father   . Hypertension Father   . Hypertension Sister   . Hypertension Brother   . Diabetes Maternal Grandmother   . Diabetes Maternal Grandfather   . Diabetes Paternal Grandmother   . Diabetes Paternal Grandfather     Past Surgical History:  Procedure Laterality Date  . FINGER SURGERY     Social History   Occupational History  . Not on file.   Social History Main Topics  . Smoking status: Never Smoker  . Smokeless tobacco: Never Used  . Alcohol use 0.0 oz/week     Comment: special occasions only.  . Drug use: No  . Sexual activity: Yes    Partners: Female      Valeria BatmanPeter W Brilee Port, MD   Note - This record has been created using [employer] software.  Chart creation errors have been sought, but may not always  have been located. Such creation errors do not reflect on  the standard of medical care.

## 2017-04-27 MED FILL — ATORVASTATIN 40 MG TABLET: 40 | 30 days supply | Qty: 30 | Fill #3

## 2017-05-24 ENCOUNTER — Ambulatory Visit (INDEPENDENT_AMBULATORY_CARE_PROVIDER_SITE_OTHER): Payer: Self-pay | Admitting: Orthopaedic Surgery

## 2017-05-24 ENCOUNTER — Encounter (INDEPENDENT_AMBULATORY_CARE_PROVIDER_SITE_OTHER): Payer: Self-pay | Admitting: Orthopaedic Surgery

## 2017-05-24 VITALS — BP 132/84 | HR 78 | Resp 14 | Ht 72.0 in | Wt 242.0 lb

## 2017-05-24 DIAGNOSIS — M25562 Pain in left knee: Secondary | ICD-10-CM

## 2017-05-24 NOTE — Progress Notes (Signed)
Office Visit Note   Patient: Robert Cantrell           Date of Birth: 1976-02-07           MRN: [id number] Visit Date: 05/24/2017              Requested by: Esperanza RichtersSaguier, Edward, PA-C [address] [place], [place] [phone] PCP: Esperanza RichtersSaguier, Edward, PA-C   Assessment & Plan: Visit Diagnoses:  1. 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 Continues to work at his full-time job at the [place]. Feels like he might be able to return to work in his second job on which he was injured. We will see back in 1 month. Long discussion regarding arthroscopic debridement on the medial meniscus depending upon his symptoms. I would give him clearance to return to his second job where he might need to be cleared by his primary care physician  Follow-Up Instructions: Return in about 1 month (around 06/24/2017).   Orders:  No orders of the defined types were placed in this encounter.  No orders of the defined types were placed in this encounter.     Procedures: No procedures performed   Clinical Data: No additional findings.   Subjective: Chief Complaint  Patient presents with  . Left Knee - Pain    Mr. Robert RectorSellars is a 41 y o that is here for a follow up of L knee pain. He relates he is not working the part time job where he injured his knee but does have an attorney that handles his bills.His knee is better with little edema+  Mr. Robert RectorSellars injured his left knee while working on-the-job the end of March. He still has some symptoms but really not having any sensation of his knee giving way or an effusion. His pain seems to be localized along the posterior medial joint when it does occur. He continues to work his full-time job for the [place]. He has not returned to his second job on which she was injured. He does feel he is capable of returning  to work  HPI  Review of Systems   Objective: Vital Signs: BP 132/84   Pulse 78   Resp 14   Ht 6' (1.829 m)   Wt 242 lb (109.8 kg)   BMI 32.82 kg/m   Physical Exam  Ortho Exam left knee without effusion. The knee was not hot warm or swollen. Predominantly posterior medial joint pain without popping or clicking. Some patellar discomfort. No instability. Negative anterior drawer sign leg raise negative. Painless range of motion both hips. Walks without a limp.  Specialty Comments:  No specialty comments available.  Imaging: No results found.   PMFS History: Patient Active Problem List   Diagnosis Date Noted  . 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 Left shoulder pain 02/26/2016  . Elevated uric acid in blood 09/23/2015  . Dyslipidemia 09/23/2015  . HTN (hypertension) 10/19/2014  . H/O phimosis 08/30/2014  . Elevated fasting blood sugar 08/06/2014  . HLD (hyperlipidemia) 08/06/2014   Past Medical History:  Diagnosis Date  . Hyperlipidemia   . Hypertension     Family History  Problem Relation Age of Onset  . Diabetes Mother   . Hypertension Mother   . Diabetes Father   . Hypertension Father   . Hypertension Sister   .  Hypertension Brother   . Diabetes Maternal Grandmother   . Diabetes Maternal Grandfather   . Diabetes Paternal Grandmother   . Diabetes Paternal Grandfather     Past Surgical History:  Procedure Laterality Date  . FINGER SURGERY     Social History   Occupational History  . Not on file.   Social History Main Topics  . Smoking status: Never Smoker  . Smokeless tobacco: Never Used  . Alcohol use 0.0 oz/week     Comment: special occasions only.  . Drug use: No  . Sexual activity: Yes    Partners: Female     Valeria Batman, MD   Note - This record has been created using [employer].  Chart creation errors have been sought, but may not always  have been located. Such creation errors do not reflect on  the standard of medical  care.

## 2017-05-28 ENCOUNTER — Ambulatory Visit: Payer: Self-pay | Admitting: Medical

## 2017-05-31 ENCOUNTER — Telehealth: Payer: Self-pay | Admitting: Medical

## 2017-05-31 ENCOUNTER — Encounter: Payer: Self-pay | Admitting: Medical

## 2017-05-31 ENCOUNTER — Ambulatory Visit (INDEPENDENT_AMBULATORY_CARE_PROVIDER_SITE_OTHER): Payer: Self-pay | Admitting: Medical

## 2017-05-31 VITALS — BP 124/83 | HR 65 | Temp 98.4°F | Resp 16 | Ht 73.0 in | Wt 251.6 lb

## 2017-05-31 DIAGNOSIS — G8929 Other chronic pain: Secondary | ICD-10-CM

## 2017-05-31 DIAGNOSIS — E118 Type 2 diabetes mellitus with unspecified complications: Secondary | ICD-10-CM

## 2017-05-31 DIAGNOSIS — E785 Hyperlipidemia, unspecified: Secondary | ICD-10-CM

## 2017-05-31 DIAGNOSIS — M25562 Pain in left knee: Secondary | ICD-10-CM

## 2017-05-31 DIAGNOSIS — I10 Essential (primary) hypertension: Secondary | ICD-10-CM

## 2017-05-31 LAB — COMPREHENSIVE METABOLIC PANEL
ALT: 18 U/L (ref 0–53)
AST: 18 U/L (ref 0–37)
Albumin: 4.4 g/dL (ref 3.5–5.2)
Alkaline Phosphatase: 83 U/L (ref 39–117)
BUN: 12 mg/dL (ref 6–23)
CO2: 28 meq/L (ref 19–32)
Calcium: 9.5 mg/dL (ref 8.4–10.5)
Chloride: 103 mEq/L (ref 96–112)
Creatinine, Ser: 0.86 mg/dL (ref 0.40–1.50)
GFR: 126.78 mL/min (ref >60.00)
GLUCOSE: 108 mg/dL — AB (ref 70–99)
POTASSIUM: 3.6 meq/L (ref 3.5–5.1)
SODIUM: 139 meq/L (ref 135–145)
Total Bilirubin: 0.5 mg/dL (ref 0.2–1.2)
Total Protein: 7.2 g/dL (ref 6.0–8.3)

## 2017-05-31 LAB — LIPID PANEL
CHOL/HDL RATIO: 4
Cholesterol: 131 mg/dL (ref 0–200)
HDL: 33.4 mg/dL — AB (ref >39.00)
LDL Cholesterol: 81 mg/dL (ref 0–99)
NONHDL: 97.34
Triglycerides: 82 mg/dL (ref 0.0–149.0)
VLDL: 16.4 mg/dL (ref 0.0–40.0)

## 2017-05-31 MED ORDER — ATORVASTATIN CALCIUM 40 MG PO TABS: 40.0000 mg | ORAL_TABLET | Freq: Every day | ORAL | 1 refills | Status: AC

## 2017-05-31 MED ORDER — AMLODIPINE BESYLATE 10 MG PO TABS: 10.0000 mg | ORAL_TABLET | Freq: Every day | ORAL | 1 refills | Status: DC

## 2017-05-31 NOTE — Telephone Encounter (Signed)
Refilled meds which should be running out per med list.

## 2017-05-31 NOTE — Patient Instructions (Signed)
Bp well controlled today. Continue current bp meds.   For high cholesterol history will get cmp and lipid panel today(fasting).  For diabetes continue metformin and low sugar diet. Continue metformin but when a1c back may need to adjust treatment.  Follow up with orthopedist. I will leave it up to you if you want to work second job. 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  Follow up date to be determined after lab review.

## 2017-05-31 NOTE — Progress Notes (Signed)
Subjective:    Patient ID: Robert Cantrell, male    DOB: 1975-12-14, 41 y.o.   MRN: [id number]  HPI  Pt in for follow up.  Pt bp looks ok today. He has not been checking his bp. No cardiac or neurologic signs or symptoms. No side effect from norvase.  Pt had type II diabetes. Mild high a1c. He is on metformin daily. Admits moderate compliance with compliant low sugars. Not exercising due to his knee injury/surgery.  Pt good cholesterol low in past. He has not been exercising.  Pt had surgery for torn medial meniscus. Also ortho told him some debridement may be needed. May need arthroscopic surgery.   Pt states ortho told him to go back to normal activity. 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 He states he is fine without that job. Does not rely on that job financially. Can do without.   Pt does not that he feels difference in how knee feels when he goes up and down steps.    Review of Systems  Constitutional: Negative for chills, fatigue and fever.  HENT: Negative for congestion, ear discharge, ear pain, mouth sores, postnasal drip, rhinorrhea, sinus pressure and trouble swallowing.   Respiratory: Negative for cough, chest tightness, shortness of breath and wheezing.   Cardiovascular: Negative for chest pain and palpitations.  Gastrointestinal: Negative for abdominal pain, constipation and rectal pain.  Genitourinary: Negative for dysuria, flank pain and frequency.  Musculoskeletal: Negative for back pain, neck pain and neck stiffness.       See hpi regarding the knee.  Skin: Negative for rash.  Neurological: Negative for dizziness, speech difficulty, light-headedness, numbness and headaches.  Hematological: Negative for adenopathy. Does not bruise/bleed easily.  Psychiatric/Behavioral: Negative for behavioral problems, confusion, self-injury and suicidal ideas. The patient is not nervous/anxious.     Past Medical History:  Diagnosis Date  . Hyperlipidemia   . Hypertension       Social History   Social History  . Marital status: Single    Spouse name: N/A  . Number of children: N/A  . Years of education: N/A   Occupational History  . Not on file.   Social History Main Topics  . Smoking status: Never Smoker  . Smokeless tobacco: Never Used  . Alcohol use 0.0 oz/week     Comment: special occasions only.  . Drug use: No  . Sexual activity: Yes    Partners: Female   Other Topics Concern  . Not on file   Social History Narrative  . No narrative on file    Past Surgical History:  Procedure Laterality Date  . FINGER SURGERY      Family History  Problem Relation Age of Onset  . Diabetes Mother   . Hypertension Mother   . Diabetes Father   . Hypertension Father   . Hypertension Sister   . Hypertension Brother   . Diabetes Maternal Grandmother   . Diabetes Maternal Grandfather   . Diabetes Paternal Grandmother   . Diabetes Paternal Grandfather     No Known Allergies  Current Outpatient Prescriptions on File Prior to Visit  Medication Sig Dispense Refill  . amLODipine (NORVASC) 10 MG tablet Take 1 tablet (10 mg total) by mouth daily. 90 tablet 1  . atorvastatin (LIPITOR) 40 MG tablet Take 1 tablet (40 mg total) by mouth daily. 90 tablet 1  . metFORMIN (GLUCOPHAGE) 500 MG tablet Take 1 tablet (500 mg total) by mouth 2 (two) times daily with a  meal. 60 tablet 3  . naproxen (NAPROSYN) 500 MG tablet Take 1 tablet (500 mg total) by mouth 2 (two) times daily with a meal. (Patient taking differently: Take 500 mg by mouth as needed. ) 30 tablet 0  . potassium chloride (K-DUR,KLOR-CON) 10 MEQ tablet 1 tab po every other day 45 tablet 0   No current facility-administered medications on file prior to visit.     BP 124/83   Pulse 65   Temp 98.4 F (36.9 C) (Oral)   Resp 16   Ht 6\' 1"  (1.854 m)   Wt 251 lb 9.6 oz (114.1 kg)   SpO2 99%   BMI 33.19 kg/m      Objective:   Physical Exam  General Mental Status- Alert. General Appearance-  Not in acute distress.   Skin General: Color- Normal Color. Moisture- Normal Moisture.  Neck Carotid Arteries- Normal color. Moisture- Normal Moisture. No carotid bruits. No JVD.  Chest and Lung Exam Auscultation: Breath Sounds:-Normal.  Cardiovascular Auscultation:Rythm- Regular. Murmurs & Other Heart Sounds:Auscultation of the heart reveals- No Murmurs.  Abdomen Inspection:-Inspeection Normal. Palpation/Percussion:Note:No mass. Palpation and Percussion of the abdomen reveal- Non Tender, Non Distended + BS, no rebound or guarding.   Neurologic Cranial Nerve exam:- CN III-XII intact(No nystagmus), symmetric smile. Strength:- 5/5 equal and symmetric strength both upper and lower extremities.  Lower ext- See quality metrics.  Left knee- good rom. Faint pain on palpation medial meniscus region. Does not appear swollen.      Assessment & Plan:  Bp well controlled today. Continue current bp meds.   For high cholesterol history will get cmp and lipid panel today(fasting).  For diabetes continue metformin and low sugar diet. Continue metformin but when a1c back may need to adjust treatment.  Follow up with orthopedist. I will leave it up to you if you want to work second job. 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  Follow up date to be determined after lab review.   Pt states he has plenty of amlodipine though epic states he does not have.Pt states pharmacy gave him more.  Kiyoshi Schaab, Ramon DredgeEdward, PA-C

## 2017-06-01 LAB — HEMOGLOBIN A1C
Hgb A1c MFr Bld: 6.7 % — ABNORMAL HIGH (ref <5.7)
MEAN PLASMA GLUCOSE: 146 mg/dL

## 2017-06-01 MED FILL — ATORVASTATIN 40 MG TABLET: 40 | 30 days supply | Qty: 30 | Fill #4

## 2017-06-25 ENCOUNTER — Ambulatory Visit (INDEPENDENT_AMBULATORY_CARE_PROVIDER_SITE_OTHER): Payer: Self-pay | Admitting: Orthopaedic Surgery

## 2017-07-09 MED FILL — ATORVASTATIN 40 MG TABLET: 40 | 30 days supply | Qty: 30 | Fill #5

## 2017-07-14 IMAGING — CR DG SHOULDER 2+V*L*
4 series · 4 of 4 positions shown · non-contrast
Comparison: None

CLINICAL DATA: LEFT shoulder pain, tenderness and decreased range
of motion since falling on it yesterday, initial encounter

EXAM:
LEFT SHOULDER - 2+ VIEW

[shoulder grashey]
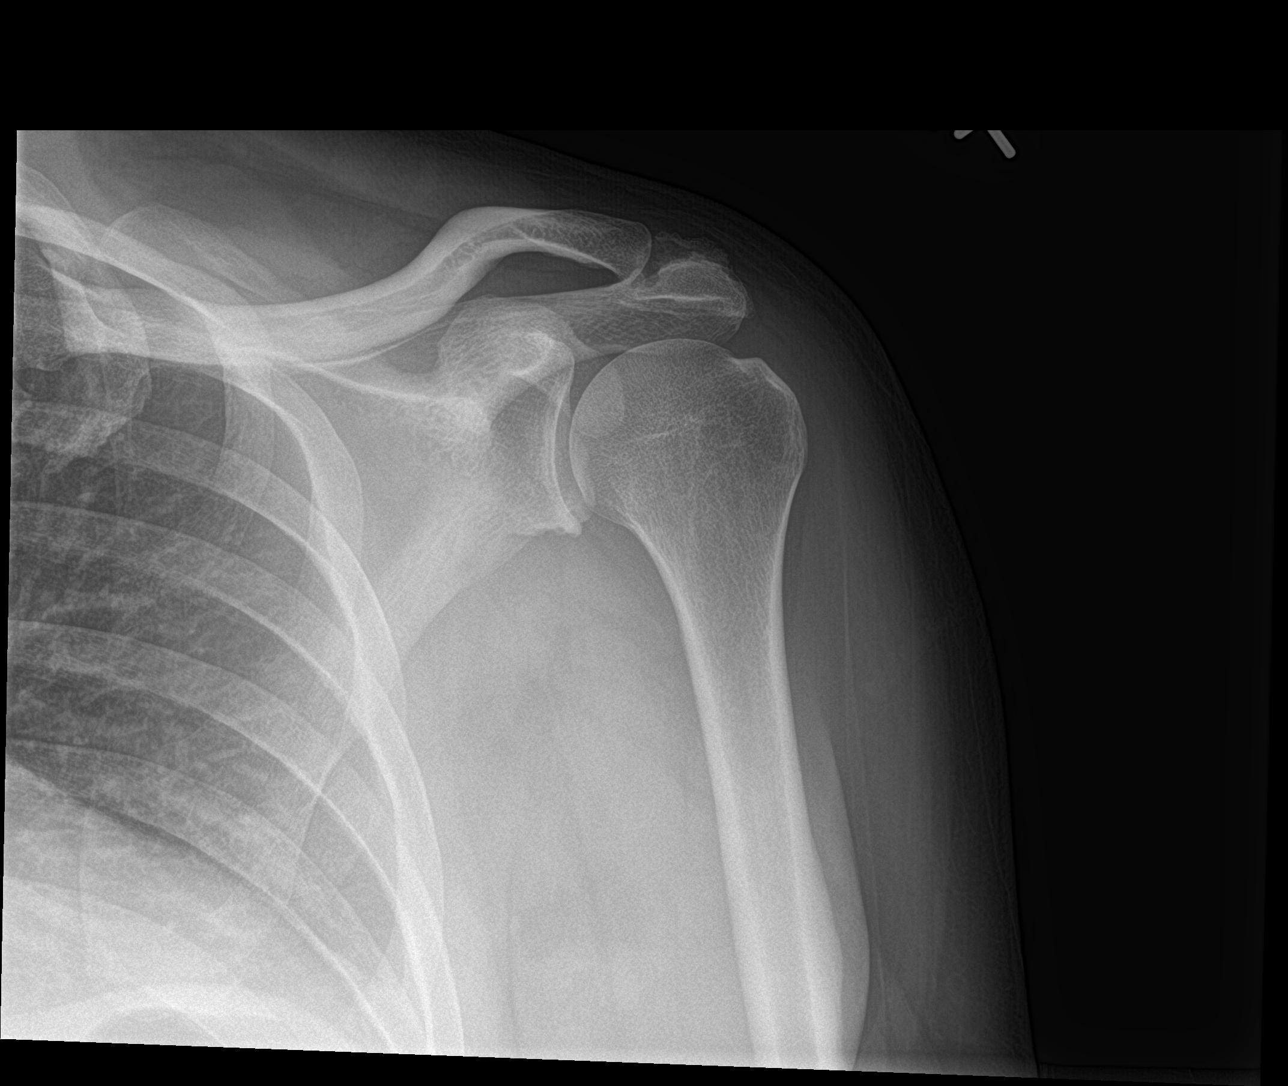

[shoulder y view (1 of 2)]
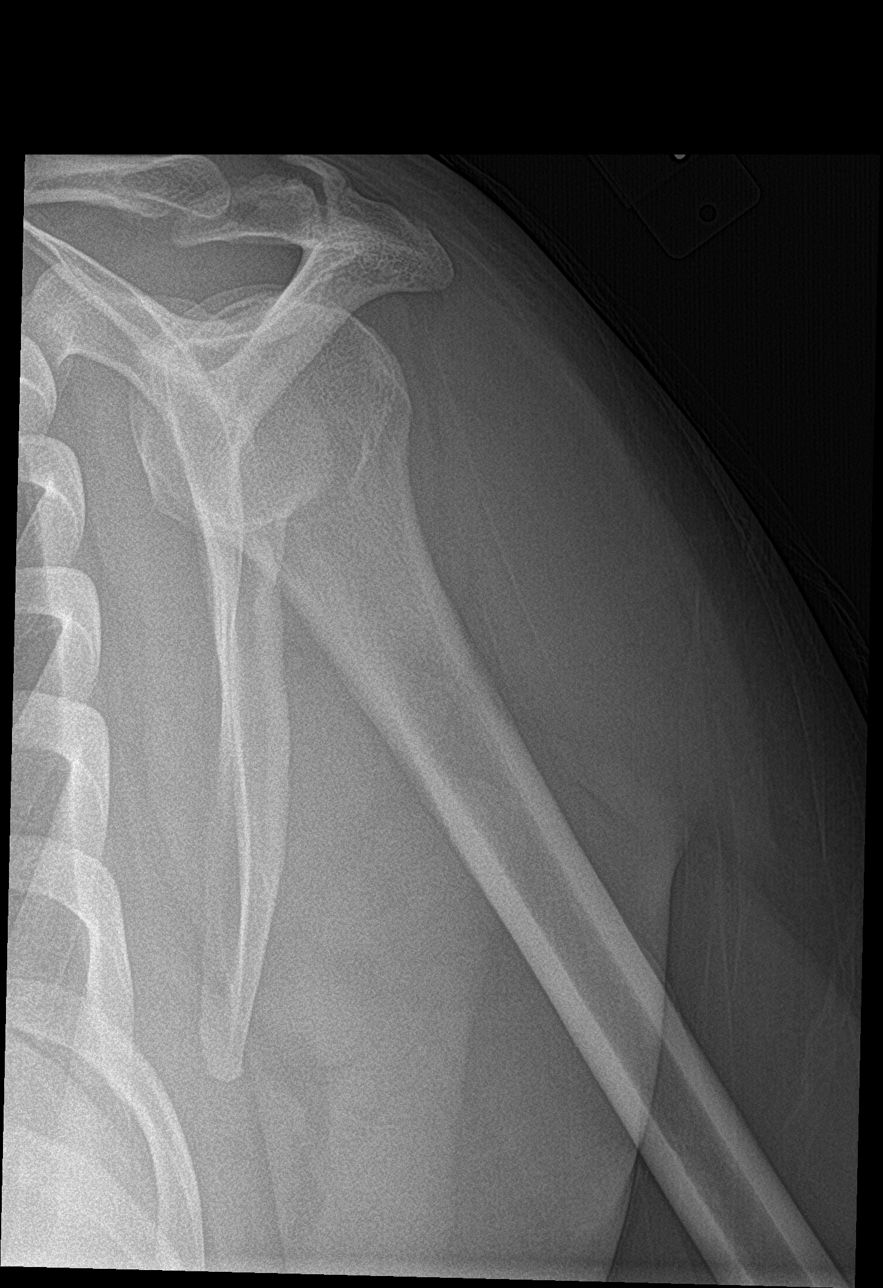

[shoulder ap neutral]
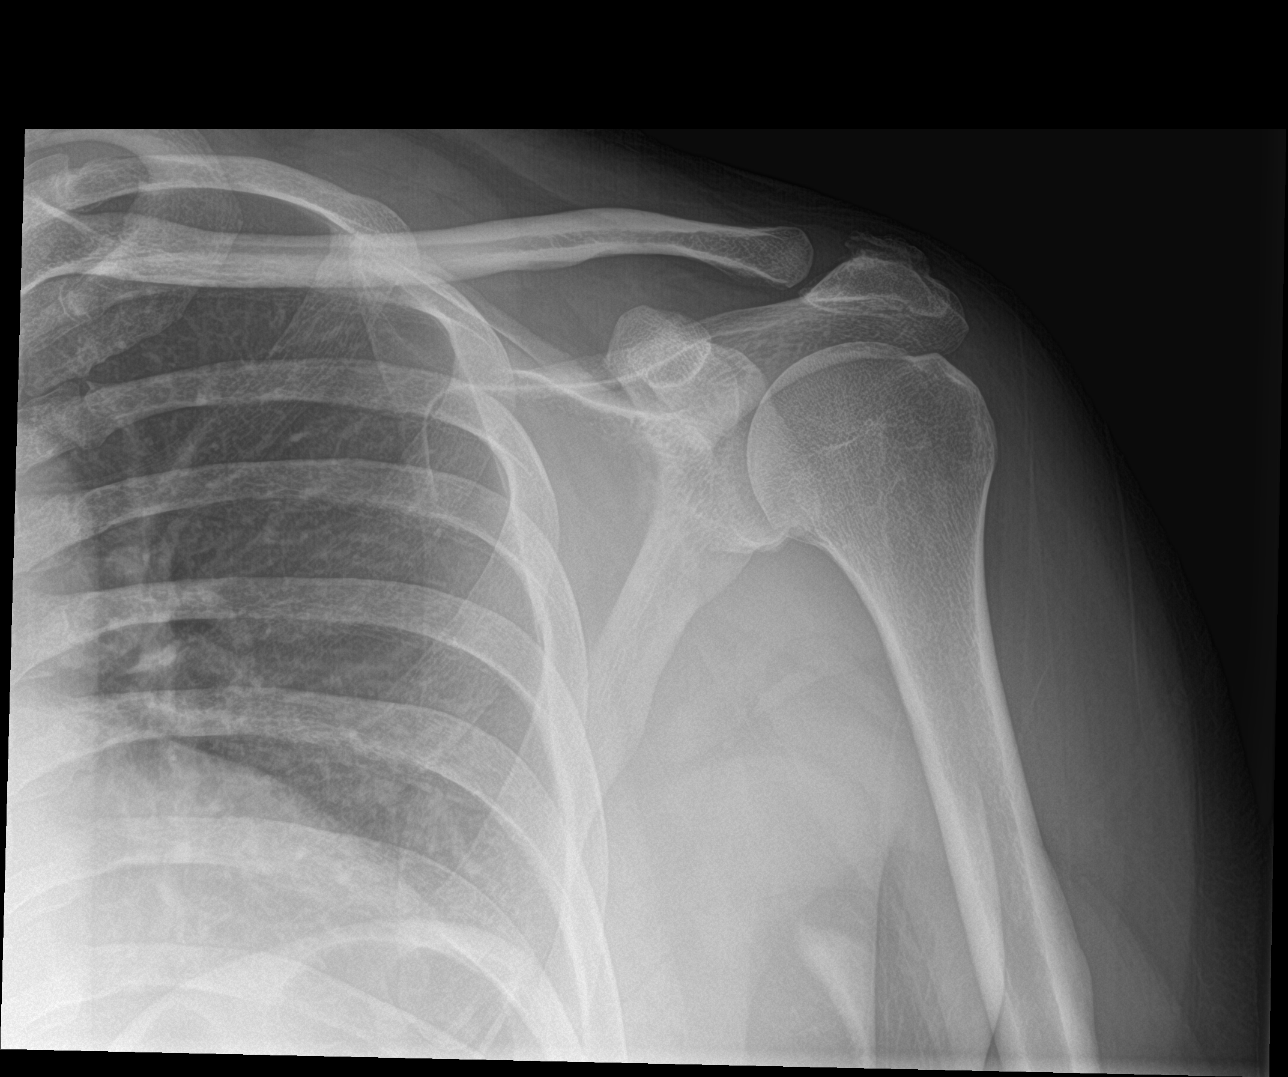

[shoulder y view (2 of 2)]
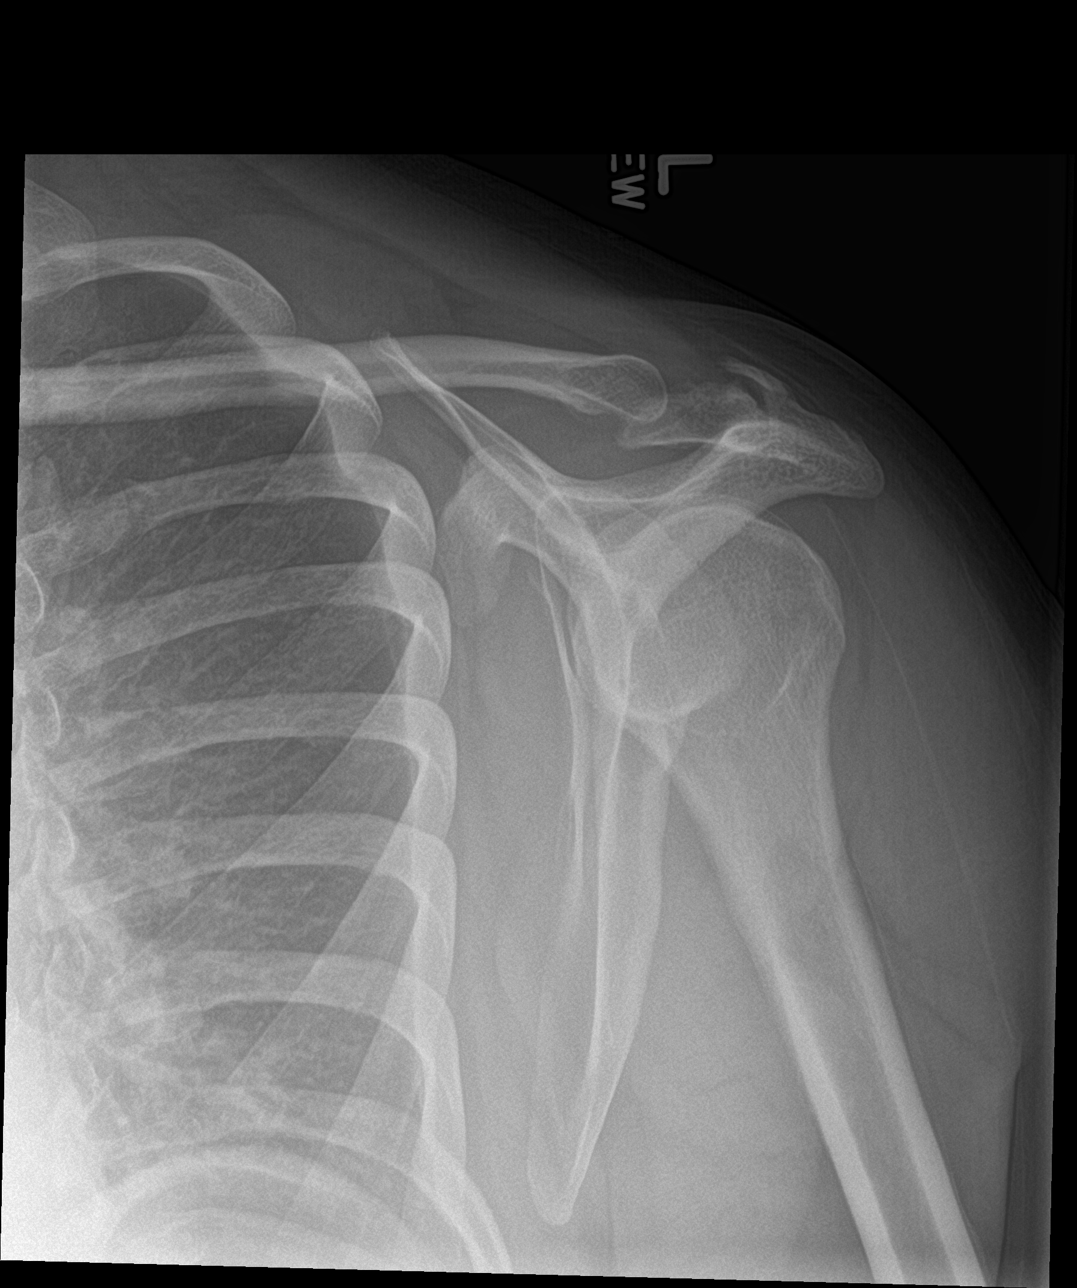

[4 of 4 positions shown; findings below may reference images not displayed]

FINDINGS: AC joint alignment normal.

Osseous mineralization normal.

Suspected os acromiale versus sequela of remote fracture with spur
formation at the junction of the ossicle and the acromial base.

This appears corticated and old, and does not have the appearance of
an acute fracture.

No definite acute fracture, dislocation or bone destruction.

Visualized LEFT ribs intact.
IMPRESSION: Suspected os acromiale versus sequela of remote fracture with
degenerative changes/spur formation at the junction of the ossicle
and the acromial base.

No definite acute bony abnormalities.

## 2017-07-22 ENCOUNTER — Other Ambulatory Visit: Payer: Self-pay | Admitting: Medical

## 2017-07-22 DIAGNOSIS — I10 Essential (primary) hypertension: Secondary | ICD-10-CM

## 2017-07-23 MED FILL — AMLODIPINE BESYLATE 10 MG T: 10 | 90 days supply | Qty: 90 | Fill #0

## 2017-08-09 ENCOUNTER — Other Ambulatory Visit: Payer: Self-pay | Admitting: Medical

## 2017-08-09 MED FILL — ATORVASTATIN 40 MG TABLET: 40 | 90 days supply | Qty: 90 | Fill #0

## 2017-10-04 IMAGING — DX DG FOOT 2V*L*
4 series · 4 of 4 positions shown · non-contrast
Comparison: None.

CLINICAL DATA: Bilateral heel and arch pain for 2 months. No known
injury. Initial encounter.

EXAM:
LEFT FOOT - 2 VIEW; RIGHT FOOT - 2 VIEW

[foot ap (1 of 2)]
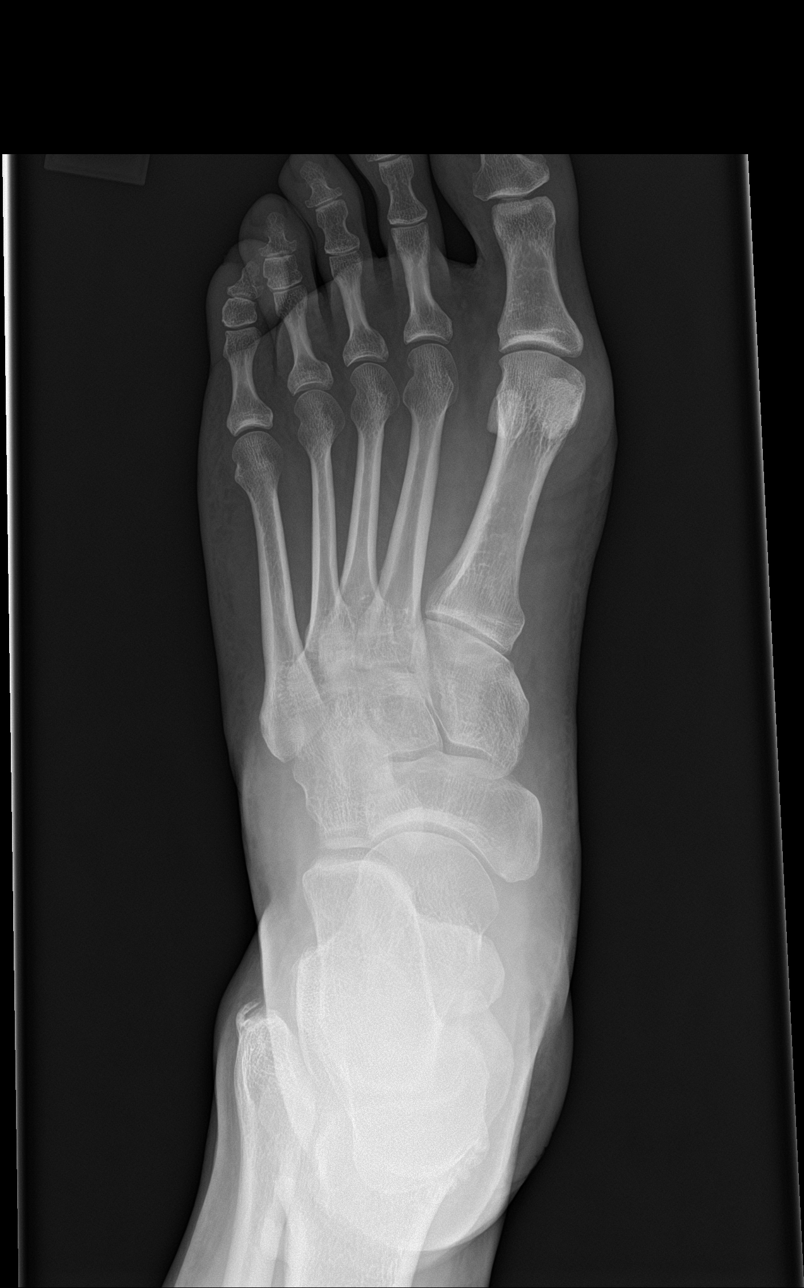

[foot lat (1 of 2)]
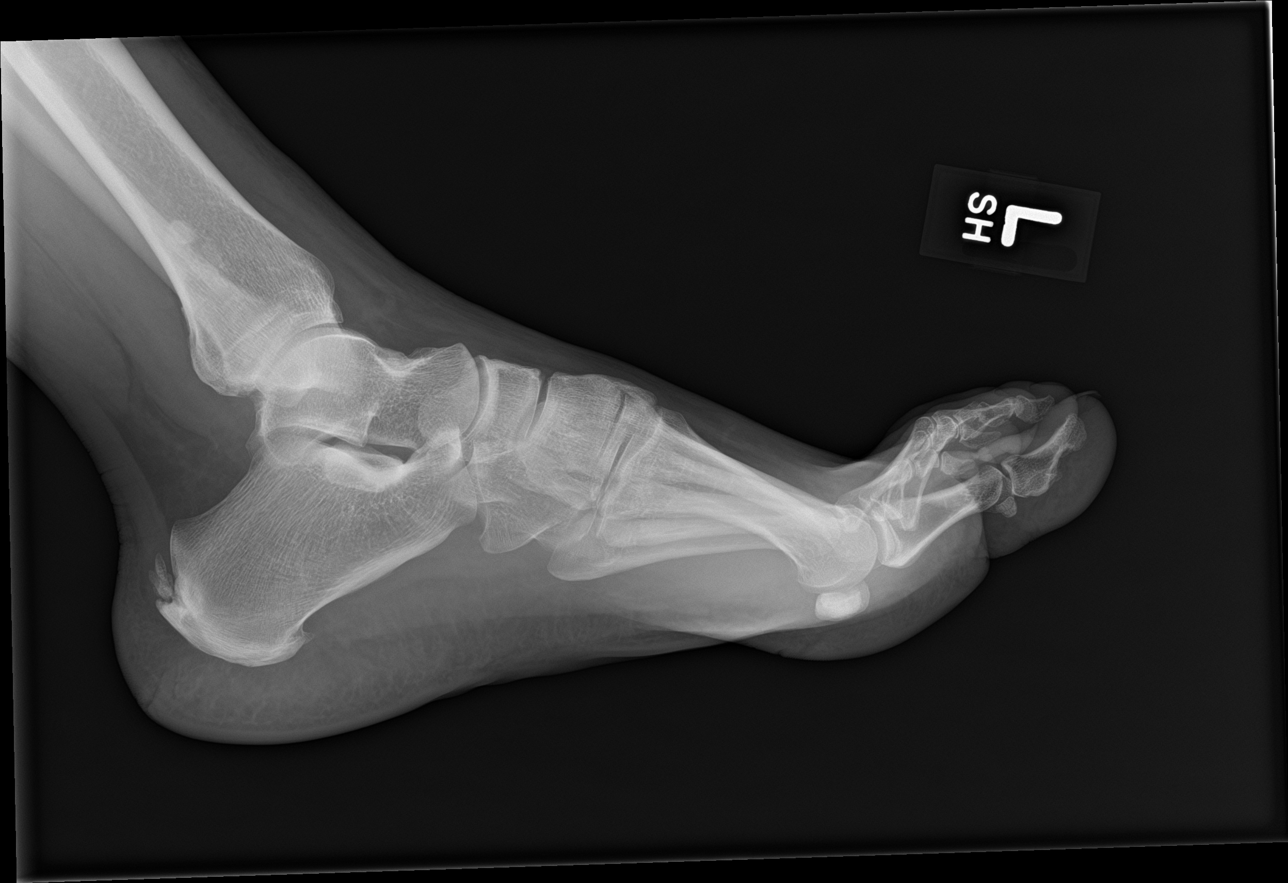

[foot ap (2 of 2)]
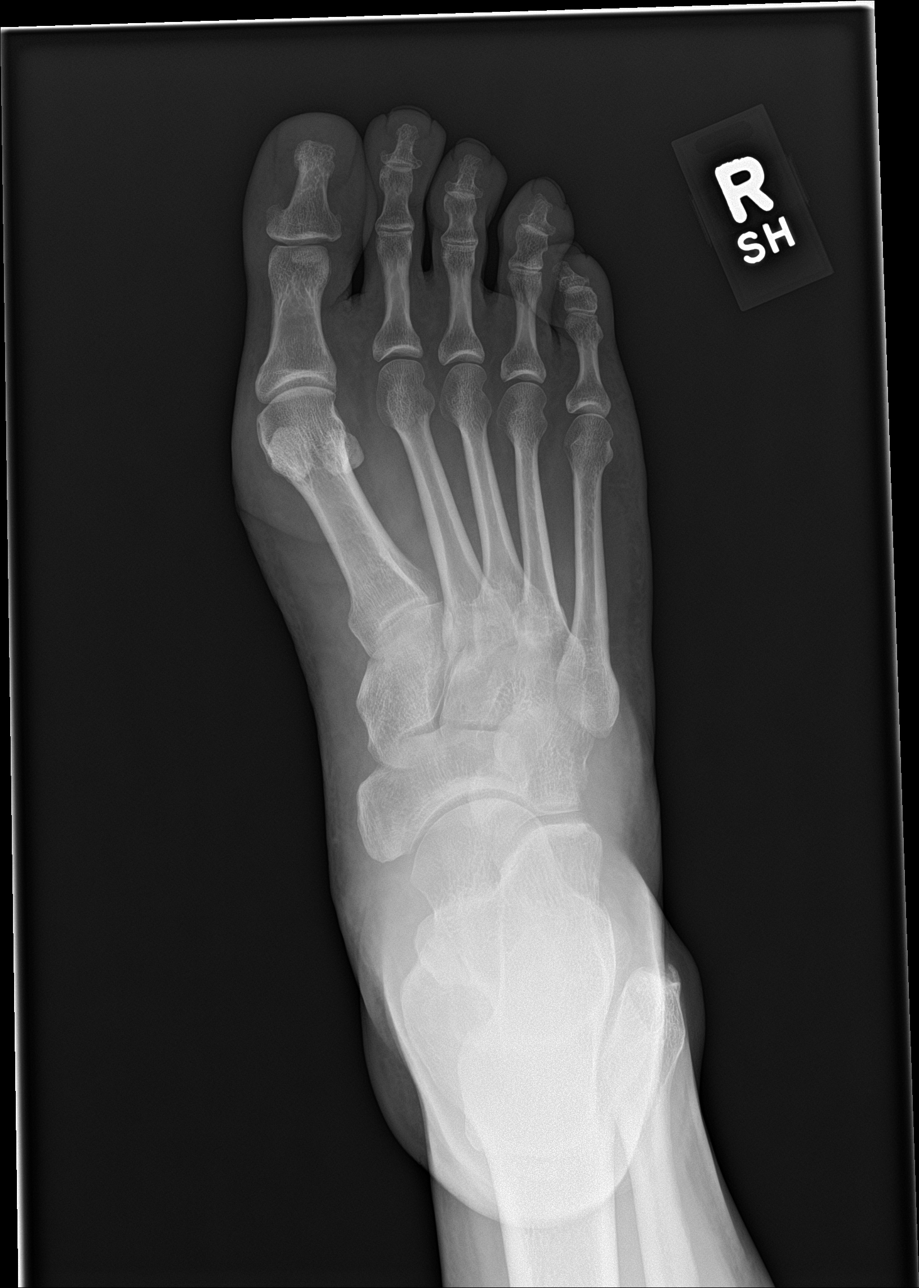

[foot lat (2 of 2)]
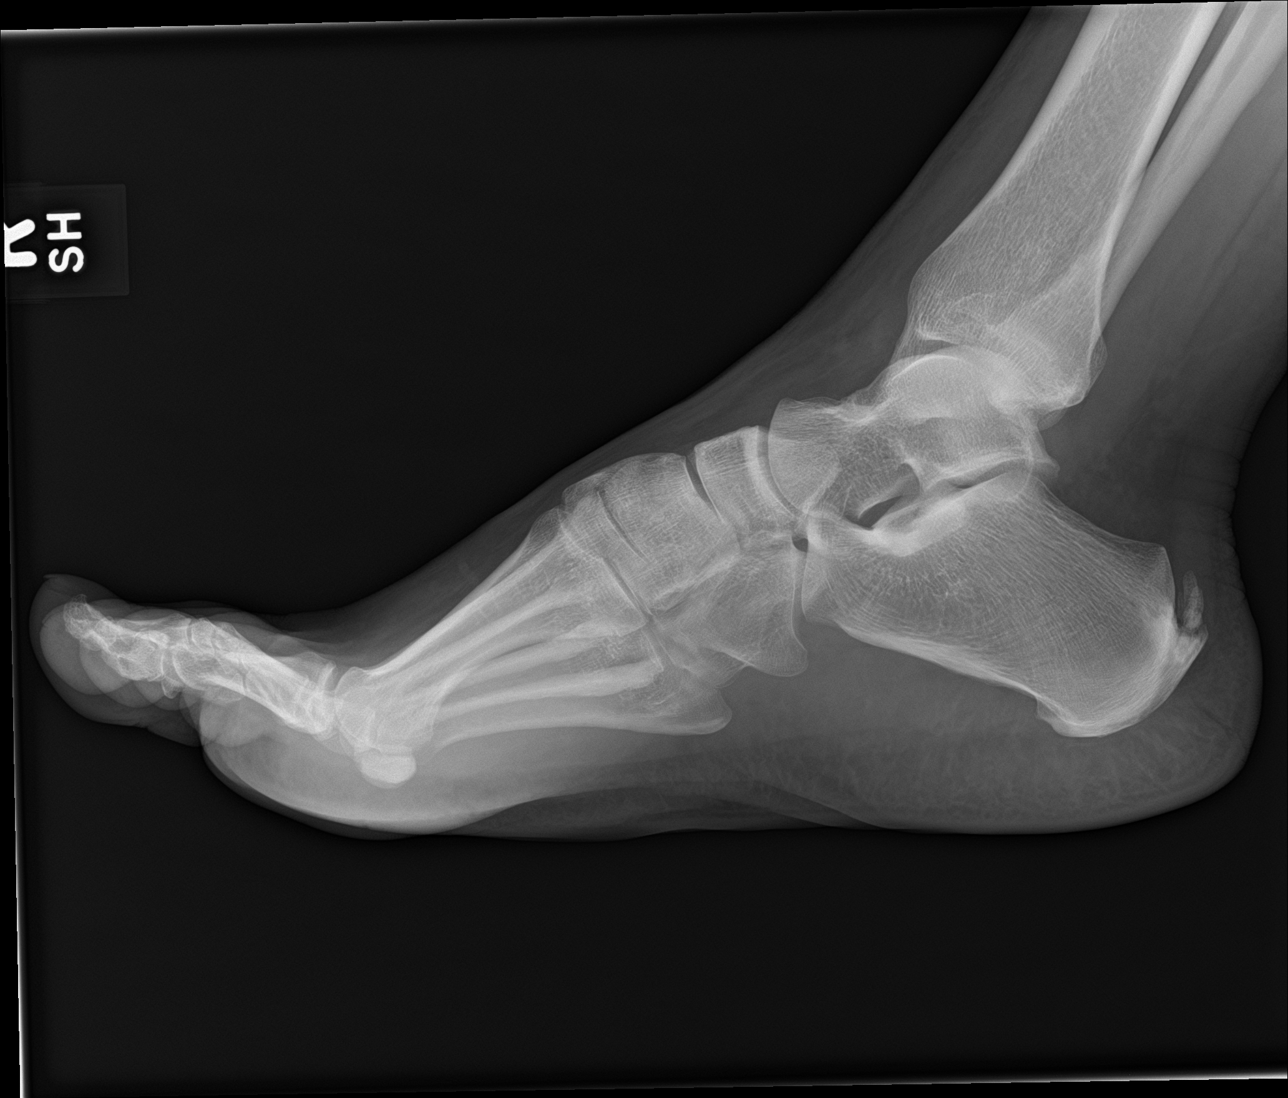

[4 of 4 positions shown; findings below may reference images not displayed]

FINDINGS: No acute bony or joint abnormality is seen on the right or left. No
evidence of arthropathy is identified. Dorsal calcaneal spurring at
the Achilles tendon insertion on both the right and left with some
ossification within the distal Achilles bilaterally are seen. Small
spurs are also seen off the lateral malleoli bilaterally.
IMPRESSION: No acute abnormality.

Dorsal calcaneal spurring with some ossification in the distal
Achilles tendons bilaterally suggestive of chronic tendinosis.

Small spurs off the lateral malleolus on both the right and left may
be due to remote trauma.

## 2017-11-08 MED FILL — AMLODIPINE BESYLATE 10 MG T: 10 | 90 days supply | Qty: 90 | Fill #1

## 2017-11-08 MED FILL — ATORVASTATIN 40 MG TABLET: 40 | 30 days supply | Qty: 30 | Fill #1

## 2017-12-24 MED FILL — ATORVASTATIN 40 MG TABLET: 40 | 30 days supply | Qty: 30 | Fill #2

## 2018-02-04 ENCOUNTER — Telehealth: Payer: Self-pay | Admitting: Medical

## 2018-02-04 DIAGNOSIS — I10 Essential (primary) hypertension: Secondary | ICD-10-CM

## 2018-02-04 MED FILL — ATORVASTATIN 40 MG TABLET: 40 | 30 days supply | Qty: 30 | Fill #3

## 2018-02-09 MED FILL — AMLODIPINE BESYLATE 10 MG T: 10 | 90 days supply | Qty: 90 | Fill #0

## 2018-02-09 NOTE — Telephone Encounter (Signed)
Pt due for follow up please call and schedule appointment.  

## 2018-02-28 NOTE — Telephone Encounter (Signed)
Patient phone number not active.

## 2018-03-14 ENCOUNTER — Other Ambulatory Visit: Payer: Self-pay | Admitting: Medical

## 2018-03-15 MED FILL — ATORVASTATIN 40 MG TABLET: 40 | 90 days supply | Qty: 90 | Fill #0

## 2018-06-02 MED FILL — AMLODIPINE BESYLATE 10 MG T: 10 | 90 days supply | Qty: 90 | Fill #1

## 2018-06-23 MED FILL — ATORVASTATIN 40 MG TABLET: 40 | 90 days supply | Qty: 90 | Fill #1

## 2018-09-12 ENCOUNTER — Other Ambulatory Visit: Payer: Self-pay | Admitting: Medical

## 2018-09-12 DIAGNOSIS — I10 Essential (primary) hypertension: Secondary | ICD-10-CM

## 2018-09-13 NOTE — Telephone Encounter (Signed)
Pt is due for follow up please call and schedule appointment.  

## 2018-10-05 ENCOUNTER — Other Ambulatory Visit: Payer: Self-pay | Admitting: Medical

## 2018-10-05 MED FILL — ATORVASTATIN 40 MG TABLET: 40 | 30 days supply | Qty: 30 | Fill #0

## 2018-10-05 NOTE — Telephone Encounter (Signed)
Pt due for follow up.Please call and schedule appointment.
